# Patient Record
Sex: Male | Born: 1995 | Race: Black or African American | Hispanic: No | Marital: Single | Smoking: Never smoker
Health system: Southern US, Community
[De-identification: ages and names within clinical notes are randomized; demographics above are authoritative.]

---
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
## 2018-09-06 ENCOUNTER — Other Ambulatory Visit: Payer: Self-pay

## 2018-09-06 DIAGNOSIS — Z20822 Contact with and (suspected) exposure to covid-19: Secondary | ICD-10-CM

## 2018-09-07 LAB — NOVEL CORONAVIRUS, NAA: SARS-CoV-2, NAA: NOT DETECTED

## 2018-12-29 ENCOUNTER — Other Ambulatory Visit: Payer: Self-pay | Admitting: *Deleted

## 2018-12-29 DIAGNOSIS — U071 COVID-19: Secondary | ICD-10-CM

## 2019-01-01 ENCOUNTER — Other Ambulatory Visit: Payer: Self-pay

## 2019-01-01 ENCOUNTER — Ambulatory Visit (HOSPITAL_COMMUNITY): Payer: BC Managed Care – PPO | Attending: Cardiology

## 2019-01-01 ENCOUNTER — Other Ambulatory Visit: Payer: BC Managed Care – PPO | Admitting: *Deleted

## 2019-01-01 DIAGNOSIS — U071 COVID-19: Secondary | ICD-10-CM

## 2019-01-01 LAB — TROPONIN I (HIGH SENSITIVITY): Troponin I (High Sensitivity): 6 ng/L

## 2019-03-13 ENCOUNTER — Other Ambulatory Visit: Payer: Self-pay | Admitting: Orthopaedic Surgery

## 2019-03-13 DIAGNOSIS — S93324A Dislocation of tarsometatarsal joint of right foot, initial encounter: Secondary | ICD-10-CM

## 2019-03-26 ENCOUNTER — Ambulatory Visit
Admission: RE | Admit: 2019-03-26 | Discharge: 2019-03-26 | Disposition: A | Discharge status: Home / Self Care | Payer: BC Managed Care – PPO | Source: Ambulatory Visit | Attending: Orthopaedic Surgery | Admitting: Orthopaedic Surgery

## 2019-03-26 ENCOUNTER — Other Ambulatory Visit: Payer: Self-pay

## 2019-03-26 DIAGNOSIS — S93324A Dislocation of tarsometatarsal joint of right foot, initial encounter: Secondary | ICD-10-CM

## 2019-03-26 IMAGING — MR MR FOOT*R* W/O CM
5 series · 40 of 40 positions shown · non-contrast
Comparison: None.

CLINICAL DATA: Football injury of the right foot.

EXAM:
MRI OF THE RIGHT FOREFOOT WITHOUT CONTRAST
TECHNIQUE: Multiplanar, multisequence MR imaging of the right forefoot was
performed. No intravenous contrast was administered.

[Series 4: T1 · coronal · right · 3.0mm · 0.31mm/px · 10 of 48 slices shown (1 of 2)]
[im 1/48]
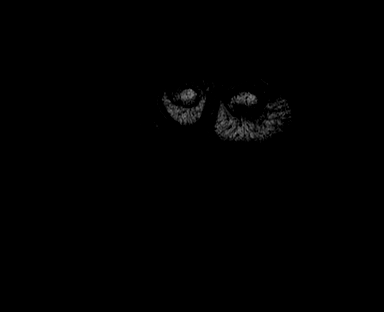
[im 6/48]
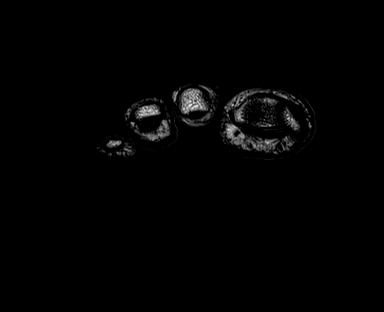
[im 11/48]
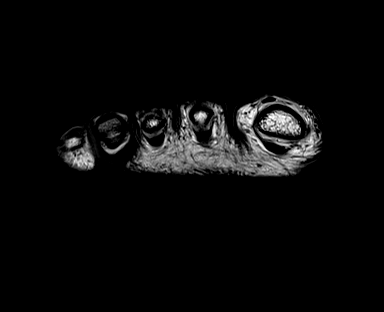
[im 16/48]
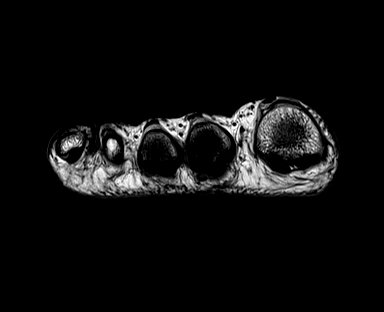
[im 21/48]
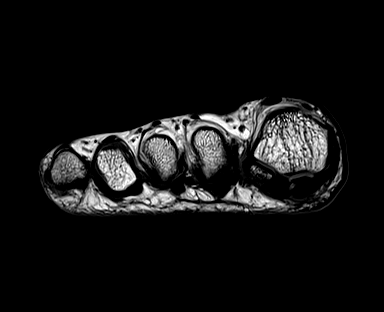
[im 27/48]
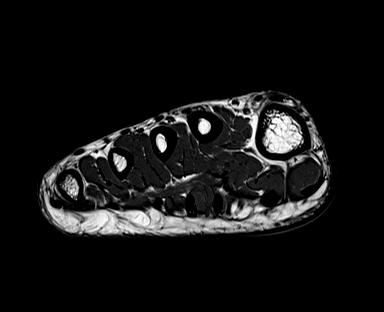
[im 32/48]
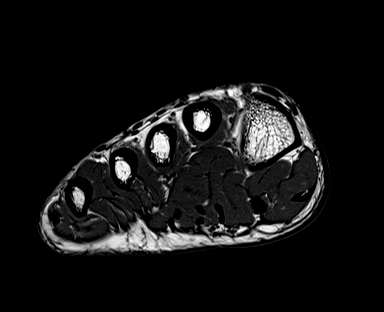
[im 37/48]
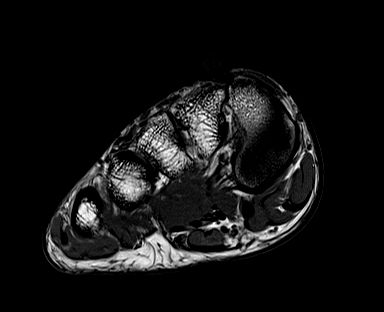
[im 42/48]
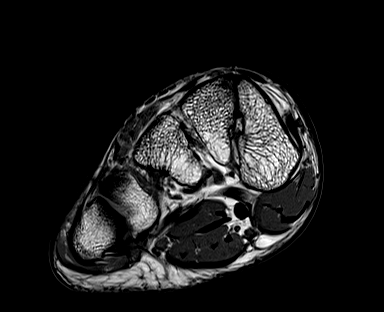
[im 48/48]
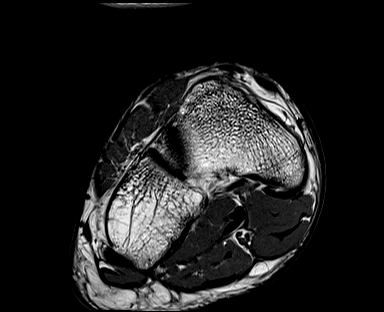

[Series 5: T2 fat-sat · coronal · right · 3.0mm · 0.31mm/px · 11 of 48 slices shown (1 of 2)]
[im 1/48]
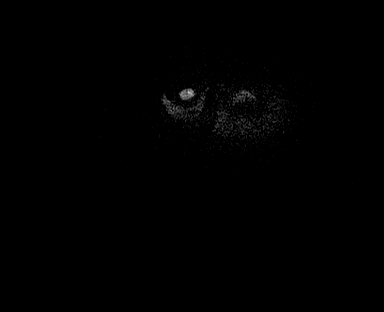
[im 5/48]
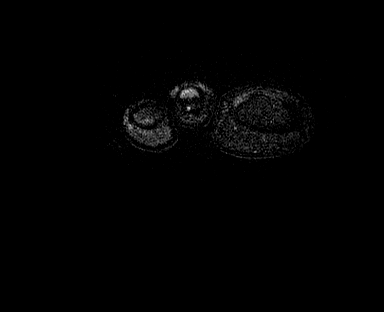
[im 10/48]
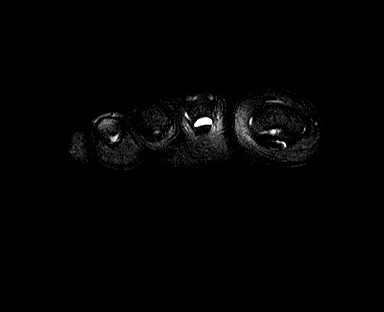
[im 15/48]
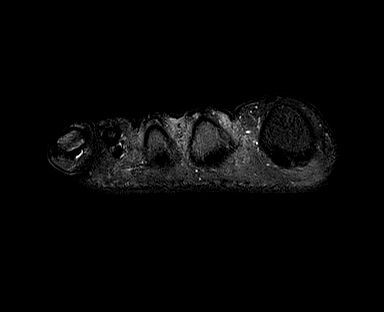
[im 19/48]
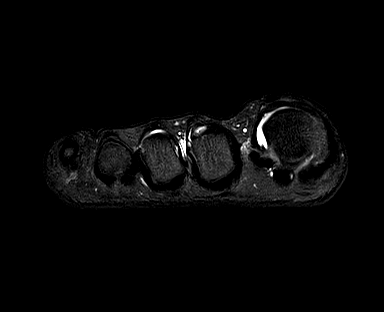
[im 24/48]
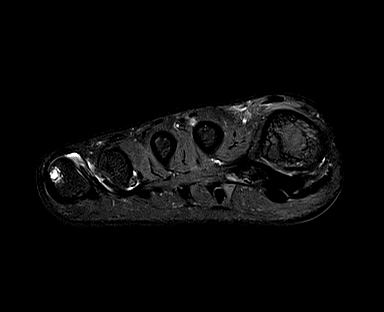
[im 29/48]
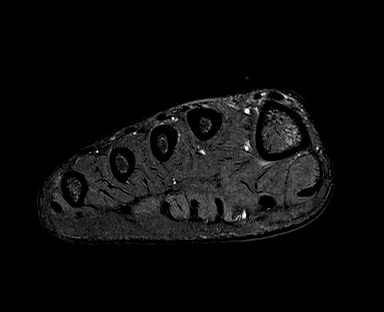
[im 33/48]
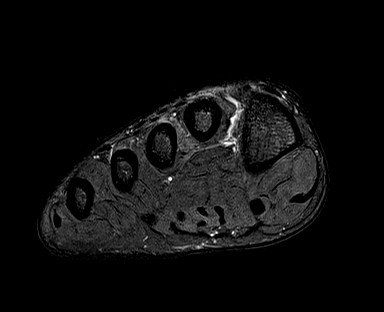
[im 38/48]
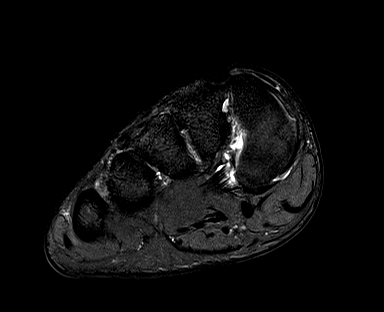
[im 43/48]
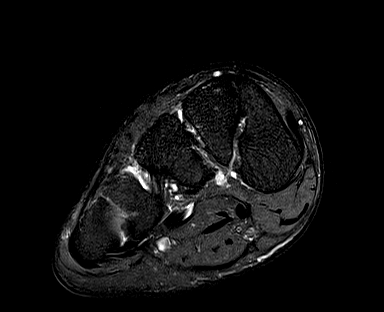
[im 48/48]
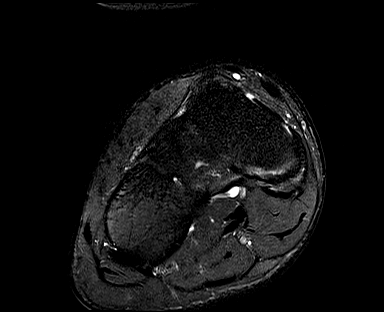

[Series 6: STIR · sagittal · right · 3.0mm · 0.70mm/px · 7 of 29 slices shown]
[im 1/29]
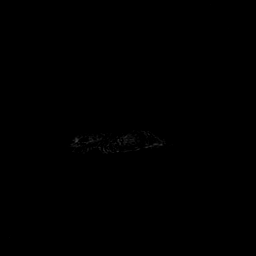
[im 5/29]
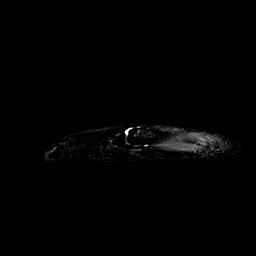
[im 10/29]
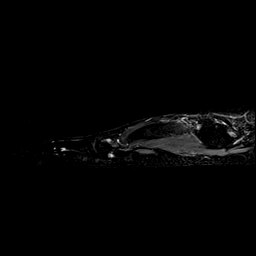
[im 15/29]
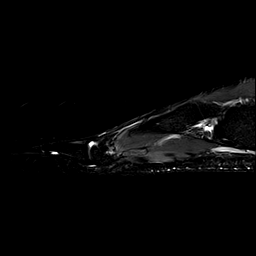
[im 19/29]
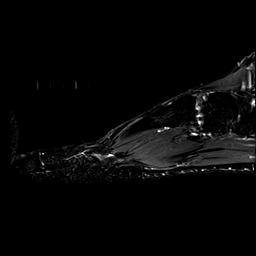
[im 24/29]
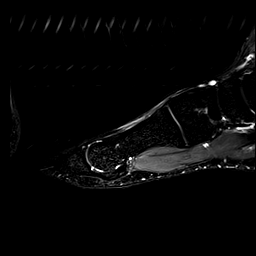
[im 29/29]
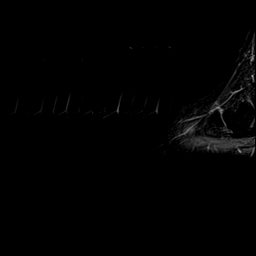

[Series 7: T1 · axial · right · 3.0mm · 0.47mm/px · z∈[-144,-57]mm · 6 of 25 slices shown (2 of 2)]
[im 1/25]
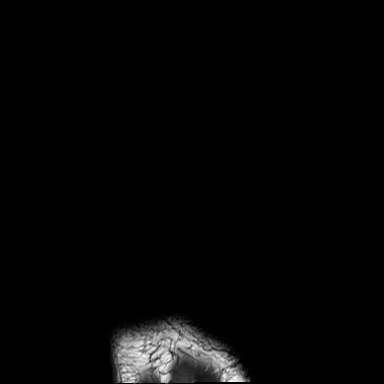
[im 5/25]
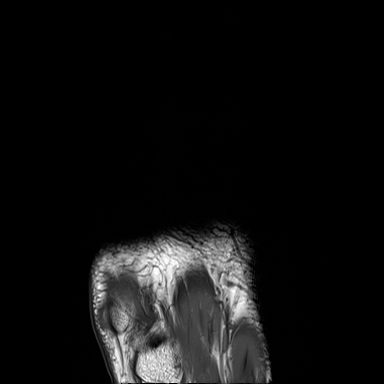
[im 10/25]
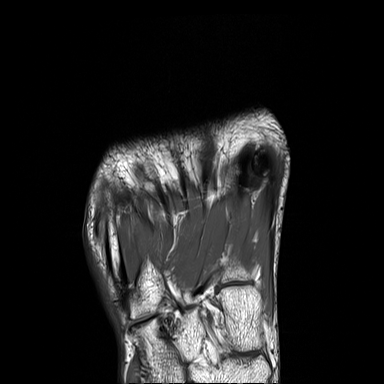
[im 15/25]
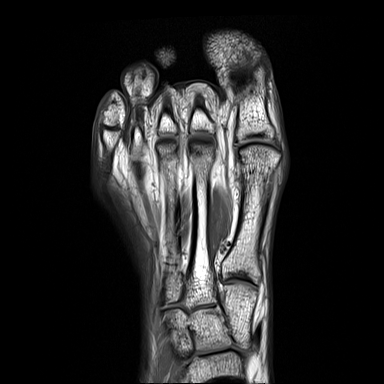
[im 20/25]
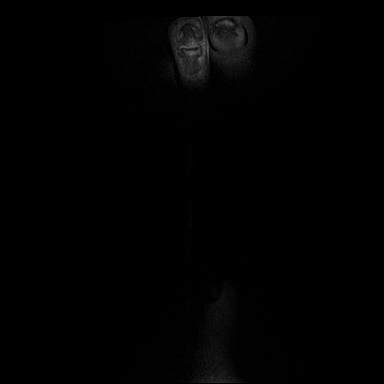
[im 25/25]
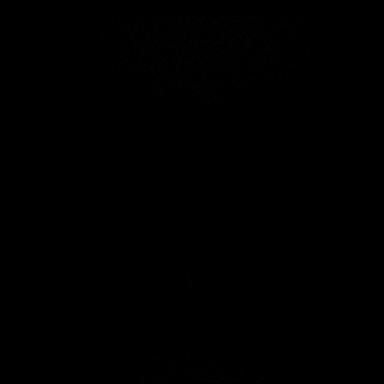

[Series 8: T2 fat-sat · axial · right · 3.0mm · 0.47mm/px · z∈[-144,-57]mm · 6 of 25 slices shown (2 of 2)]
[im 1/25]
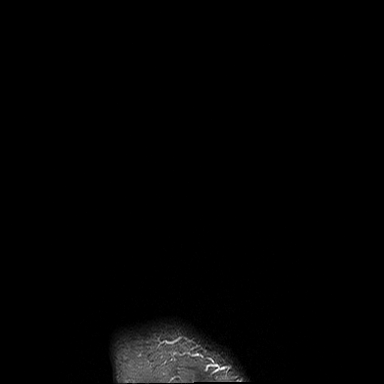
[im 5/25]
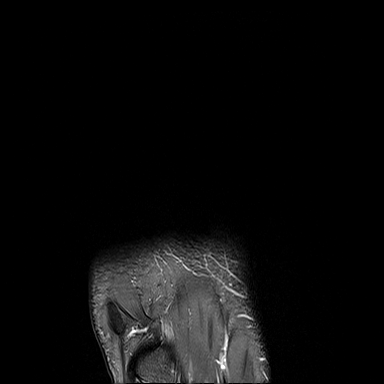
[im 10/25]
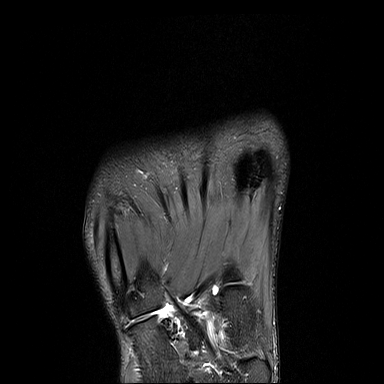
[im 15/25]
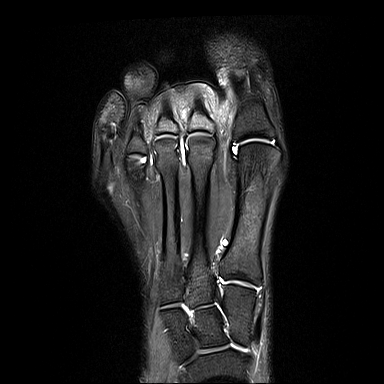
[im 20/25]
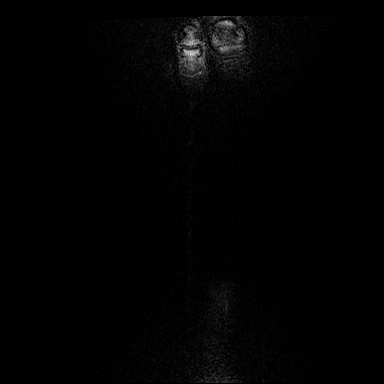
[im 25/25]
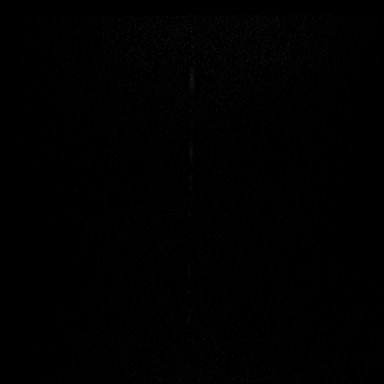

[40 of 40 positions shown; findings below may reference images not displayed]

FINDINGS: Bones/Joint/Cartilage

Markers indicating site of discomfort are just above the base and
the head of the second metatarsal. No abnormal marrow edema is
present along the metatarsal bases or adjacent tarsal bones. There
is a small dorsal effusion between the navicular and the middle
cuneiform shown on image [DATE]. No malalignment at the Lisfranc joint.
No compelling findings of metatarsal stress fracture.

A 0.3 cm focus of accentuated T2 signal is present laterally in the
head of the fifth metatarsal probably represents a small geode.
First digit sesamoids unremarkable.

Ligaments

The Lisfranc ligament appears intact on images 11-12 of series 8.

Muscles and Tendons

Mild distal peroneus longus tenosynovitis

Soft tissues

Unremarkable
IMPRESSION: 1. Small dorsal effusion between the navicular and the middle
cuneiform, without associated marrow edema.
2. Mild distal peroneus longus tenosynovitis.
3. Small geode in the head of the fifth metatarsal.
4. No findings of Lisfranc joint derangement or metatarsal stress
fracture. The Lisfranc ligament appears intact.

## 2019-04-02 ENCOUNTER — Other Ambulatory Visit: Payer: BC Managed Care – PPO

## 2019-05-14 ENCOUNTER — Other Ambulatory Visit: Payer: Self-pay

## 2019-05-14 ENCOUNTER — Ambulatory Visit: Payer: BC Managed Care – PPO | Attending: Family

## 2019-05-14 DIAGNOSIS — Z23 Encounter for immunization: Secondary | ICD-10-CM

## 2019-05-14 NOTE — Progress Notes (Addendum)
   Covid[facility] Vaccination Clinic  Name:  Johnathan Burgess    MRN: [id number] DOB: [birth year]  05/14/2019  Mr. Stickels was observed post Covid-19 immunization for 15 minutes without incident. He was provided with Vaccine Information Sheet and instruction to access the V-Safe system.    Covid-19 Vaccination Clinic  Name:  Johnathan Burgess    MRN: [id number] DOB: July 11, 1995  05/14/2019  Mr. Beddow was observed post Covid-19 immunization for 15 minutes without incident. He was provided with Vaccine Information Sheet and instruction to access the V-Safe system.   Mr. Prosise was instructed to call 911 with any severe reactions post vaccine: Marland Kitchen Difficulty breathing  . Swelling of face and throat  . A fast heartbeat  . A bad rash all over body  . Dizziness and weakness   Immunizations Administered    Name Date Dose VIS Date Route   Moderna COVID-19 Vaccine 05/14/2019 10:10 AM 0.5 mL 12/2018 Intramuscular   Manufacturer: Gala Murdoch   Lot: [id number]   NDC: 93818-299-37      Mr. Ra was instructed to call 911 with any severe reactions post vaccine: Marland Kitchen Difficulty breathing  . Swelling of face and throat  . A fast heartbeat  . A bad rash all over body  . Dizziness and weakness   Immunizations Administered    Name Date Dose VIS Date Route   Moderna COVID-19 Vaccine 05/14/2019 10:10 AM 0.5 mL 12/2018 Intramuscular   Manufacturer: Moderna   Lot: [id number]   NDC: 38101-751-02

## 2019-06-09 ENCOUNTER — Ambulatory Visit: Payer: BC Managed Care – PPO | Attending: Family

## 2019-06-09 DIAGNOSIS — Z23 Encounter for immunization: Secondary | ICD-10-CM

## 2019-06-09 NOTE — Progress Notes (Signed)
   Covid-19 Vaccination Clinic  Name:  Johnathan Burgess    MRN: [id number] DOB: [birth year]  06/09/2019  [name] was observed post Covid-19 immunization for 15 minutes without incident. He was provided with Vaccine Information Sheet and instruction to access the V-Safe system.   [name] was instructed to call 911 with any severe reactions post vaccine: Marland Kitchen Difficulty breathing  . Swelling of face and throat  . A fast heartbeat  . A bad rash all over body  . Dizziness and weakness   Immunizations Administered    Name Date Dose VIS Date Route   Moderna COVID-19 Vaccine 06/09/2019  2:47 PM 0.5 mL 12/2018 Intramuscular   Manufacturer: Moderna   Lot: [id number]   NDC: 28206-015-61

## 2020-01-11 ENCOUNTER — Ambulatory Visit: Payer: BC Managed Care – PPO | Attending: Family

## 2020-01-11 DIAGNOSIS — Z23 Encounter for immunization: Secondary | ICD-10-CM

## 2020-05-17 NOTE — Progress Notes (Signed)
   Covid-19 Vaccination Clinic  Name:  Demon Volante    MRN: [id number] DOB: 06-11-1995  05/17/2020  Mr. Greenblatt was observed post Covid-19 immunization for 15 minutes without incident. He was provided with Vaccine Information Sheet and instruction to access the V-Safe system.   Mr. Seymore was instructed to call 911 with any severe reactions post vaccine: Marland Kitchen Difficulty breathing  . Swelling of face and throat  . A fast heartbeat  . A bad rash all over body  . Dizziness and weakness   Immunizations Administered    Name Date Dose VIS Date Route   Moderna Covid-19 Booster Vaccine 01/11/2020  9:15 AM 0.25 mL 11/11/2019 Intramuscular   Manufacturer: Moderna   Lot: [id number]   NDC: 47092-957-47

## 2020-10-11 ENCOUNTER — Other Ambulatory Visit: Payer: Self-pay

## 2020-10-11 ENCOUNTER — Ambulatory Visit
Admission: RE | Admit: 2020-10-11 | Discharge: 2020-10-11 | Disposition: A | Discharge status: Home / Self Care | Payer: BC Managed Care – PPO | Source: Ambulatory Visit | Attending: Family Medicine | Admitting: Family Medicine

## 2020-10-11 ENCOUNTER — Other Ambulatory Visit: Payer: Self-pay | Admitting: Family Medicine

## 2020-10-11 DIAGNOSIS — M542 Cervicalgia: Secondary | ICD-10-CM

## 2020-10-11 IMAGING — CT CT CERVICAL SPINE W/O CM
3 series · 8 of 14 positions shown, 9 images · non-contrast
Comparison: None.

CLINICAL DATA: Left neck pain, left arm paresthesias, headache,
recent injury

EXAM:
CT CERVICAL SPINE WITHOUT CONTRAST
TECHNIQUE: Multidetector CT imaging of the cervical spine was performed without
intravenous contrast. Multiplanar CT image reconstructions were also
generated.

[Series 3: cspine soft · axial · 0.33mm/px · z∈[-237,-169]mm · 2 of 102 slices shown]
[im 34/102  soft-tissue]
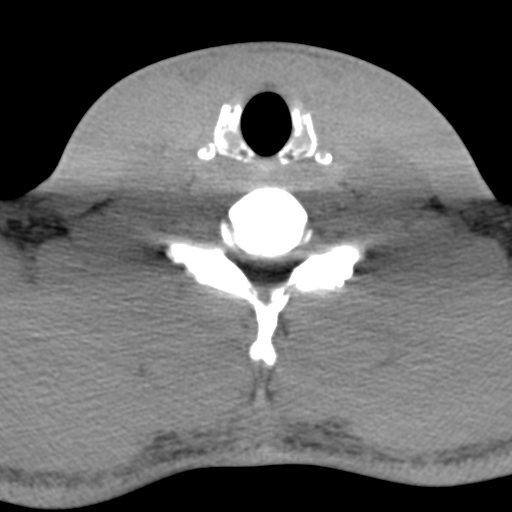
[im 68/102  soft-tissue]
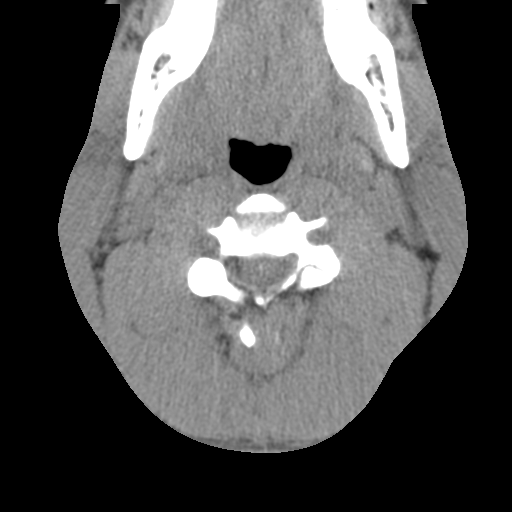

[Series 9: angled axial soft · axial · 0.29mm/px · z∈[-264,-162]mm · 3 of 103 slices shown]
[im 26/103  soft-tissue]
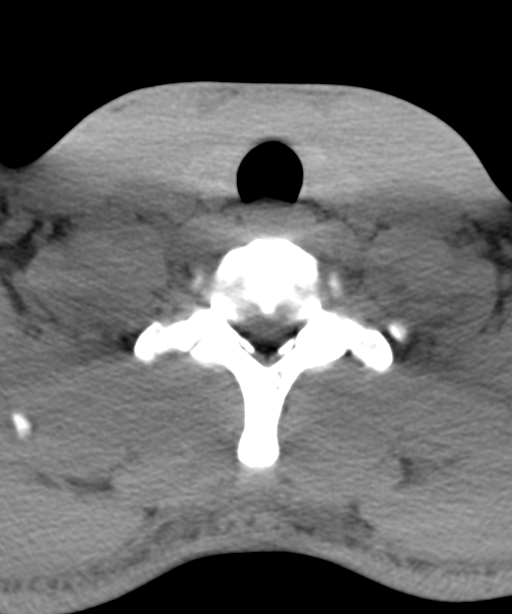
[im 52/103  soft-tissue]
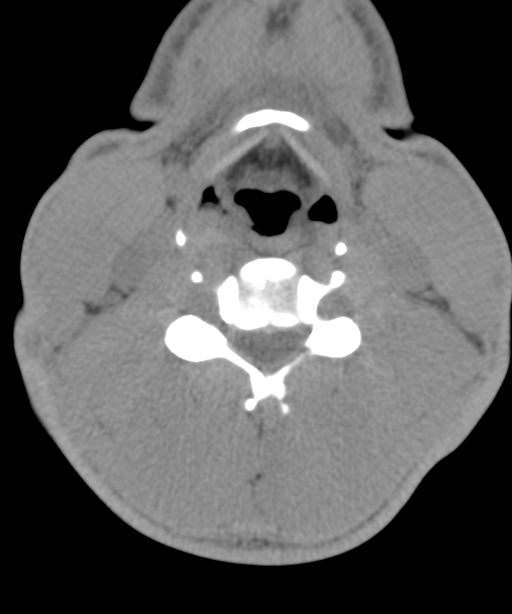
[im 77/103  soft-tissue]
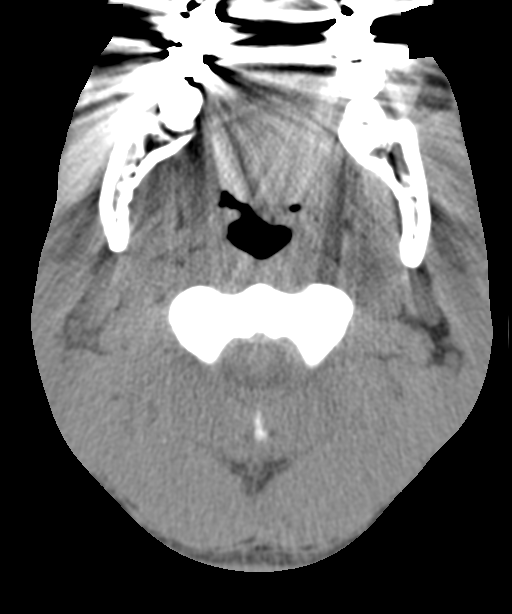

[Series 10: angled axial bone · axial · 0.29mm/px · z∈[-266,-164]mm · 3 of 103 slices shown, 4 images]
[im 26/103  soft-tissue]
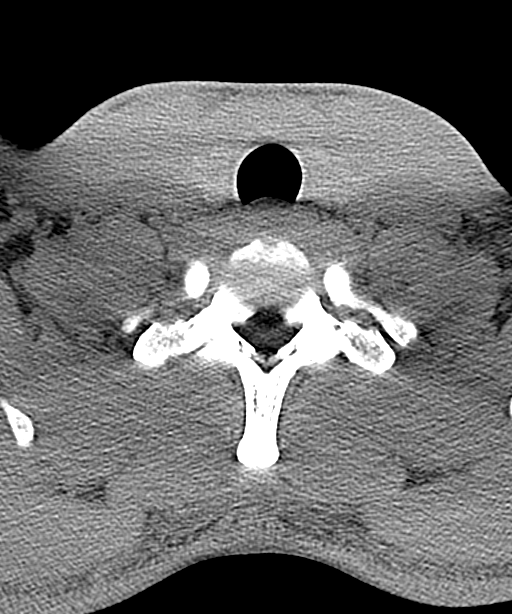
[im 26/103  bone]
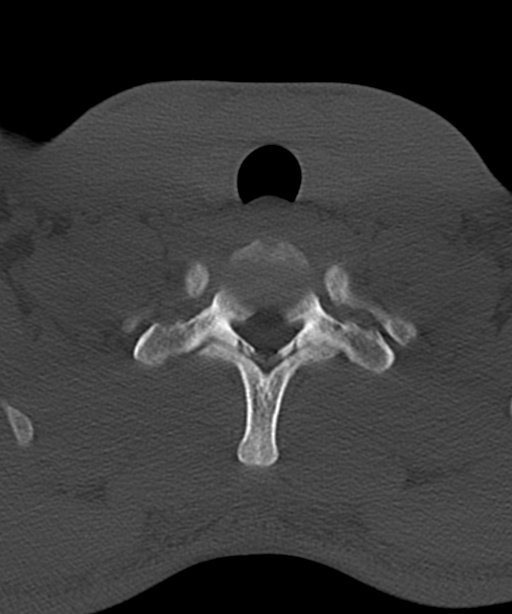
[im 52/103  bone]
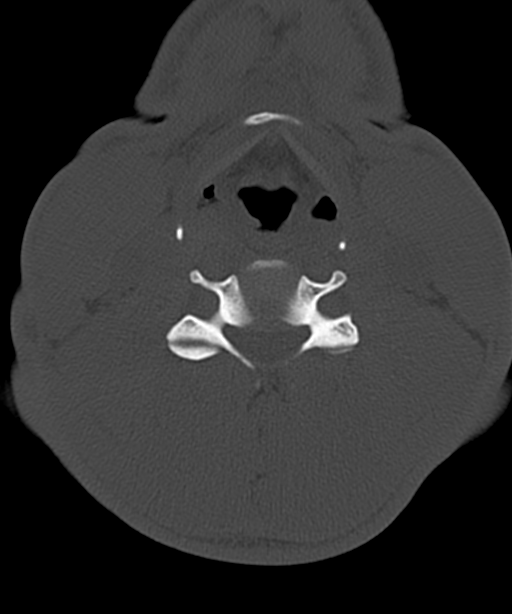
[im 77/103  bone]
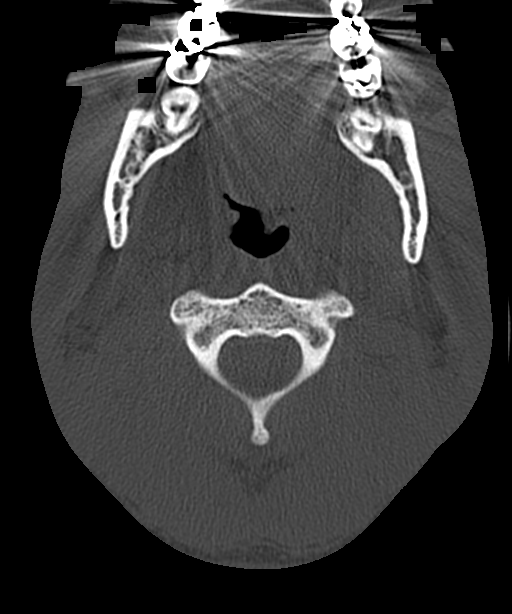

[8 of 14 positions shown; findings below may reference images not displayed]

FINDINGS: Alignment: Normal.

Skull base and vertebrae: No acute fracture. No primary bone lesion
or focal pathologic process.

Soft tissues and spinal canal: No prevertebral fluid or swelling. No
visible canal hematoma.

Disc levels: Preserved vertebral body heights. Facets are aligned.
No subluxation or dislocation. Intact odontoid. Very slight disc
space narrowing at C7-T1 with minor endplate bony spurring
posteriorly. These changes are best appreciated on the sagittal
reconstructions. Otherwise disc spaces preserved. No facet
arthropathy. Neural foramina are widely patent.

Upper chest: Negative.

Other: None.
IMPRESSION: No acute osseous finding, fracture or malalignment by CT.

## 2020-11-05 ENCOUNTER — Emergency Department (HOSPITAL_COMMUNITY)
Admission: EM | Admit: 2020-11-05 | Discharge: 2020-11-05 | Disposition: A | Discharge status: Home / Self Care | Payer: BC Managed Care – PPO | Attending: Emergency Medicine | Admitting: Emergency Medicine

## 2020-11-05 ENCOUNTER — Emergency Department (HOSPITAL_COMMUNITY): Payer: BC Managed Care – PPO

## 2020-11-05 ENCOUNTER — Other Ambulatory Visit: Payer: Self-pay

## 2020-11-05 DIAGNOSIS — R531 Weakness: Secondary | ICD-10-CM | POA: Diagnosis not present

## 2020-11-05 DIAGNOSIS — S139XXA Sprain of joints and ligaments of unspecified parts of neck, initial encounter: Secondary | ICD-10-CM | POA: Diagnosis not present

## 2020-11-05 DIAGNOSIS — Y9361 Activity, american tackle football: Secondary | ICD-10-CM | POA: Diagnosis not present

## 2020-11-05 DIAGNOSIS — W51XXXA Accidental striking against or bumped into by another person, initial encounter: Secondary | ICD-10-CM | POA: Diagnosis not present

## 2020-11-05 DIAGNOSIS — R202 Paresthesia of skin: Secondary | ICD-10-CM | POA: Diagnosis not present

## 2020-11-05 DIAGNOSIS — M79602 Pain in left arm: Secondary | ICD-10-CM

## 2020-11-05 DIAGNOSIS — M79601 Pain in right arm: Secondary | ICD-10-CM

## 2020-11-05 DIAGNOSIS — S199XXA Unspecified injury of neck, initial encounter: Secondary | ICD-10-CM | POA: Diagnosis present

## 2020-11-05 IMAGING — MR MR CERVICAL SPINE W/O CM
4 of 6 series · 20 of 48 positions shown · non-contrast
Comparison: None.

CLINICAL DATA: Football injury

EXAM:
MRI CERVICAL SPINE WITHOUT CONTRAST
TECHNIQUE: Multiplanar, multisequence MR imaging of the cervical spine was
performed. No intravenous contrast was administered.

[Series 2: T2 · sagittal · 3.0mm · 0.43mm/px · 4 of 17 slices shown (1 of 2)]
[im 1/17]
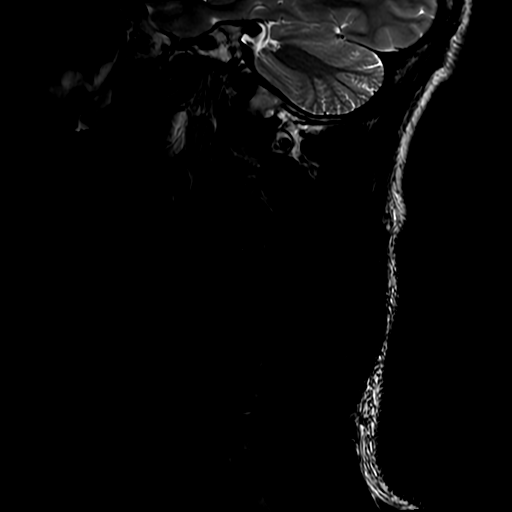
[im 6/17]
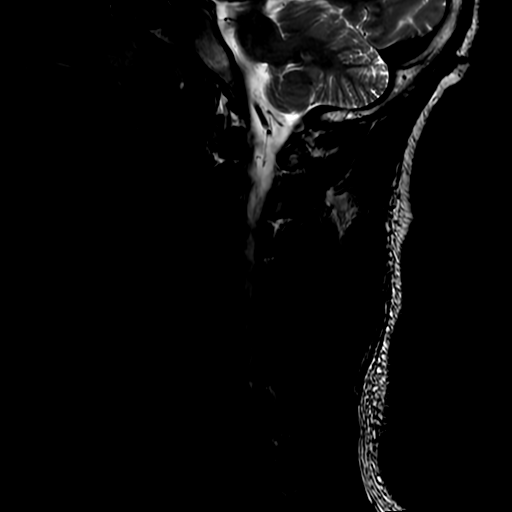
[im 11/17]
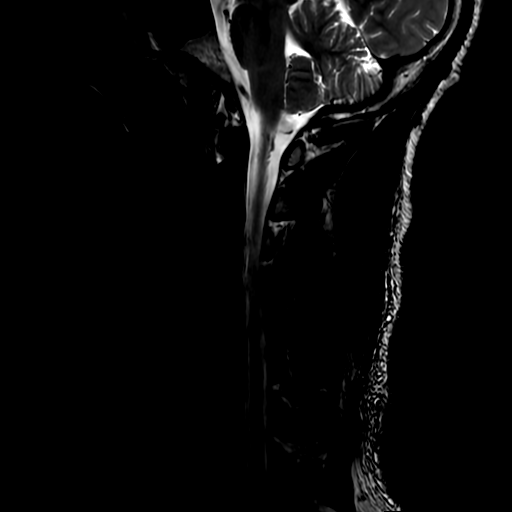
[im 17/17]
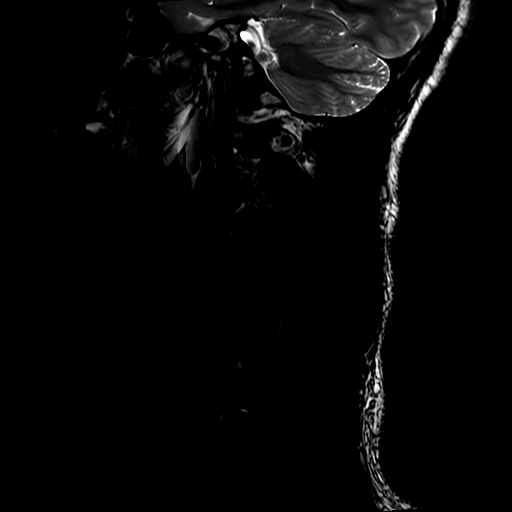

[Series 3: FLAIR · sagittal · 3.0mm · 0.43mm/px · 4 of 17 slices shown]
[im 1/17]
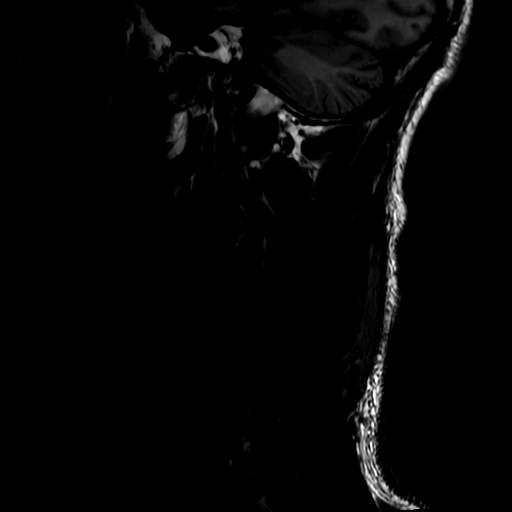
[im 6/17]
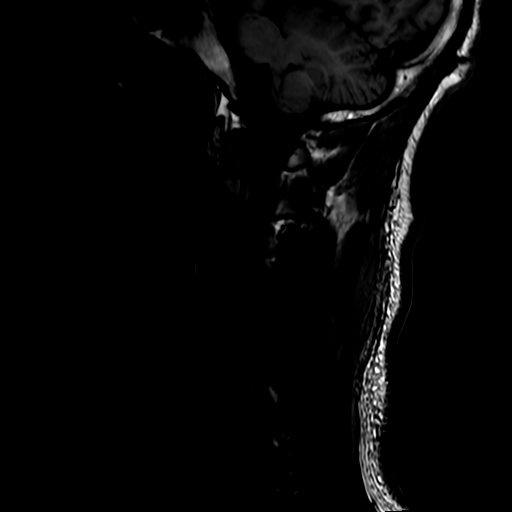
[im 11/17]
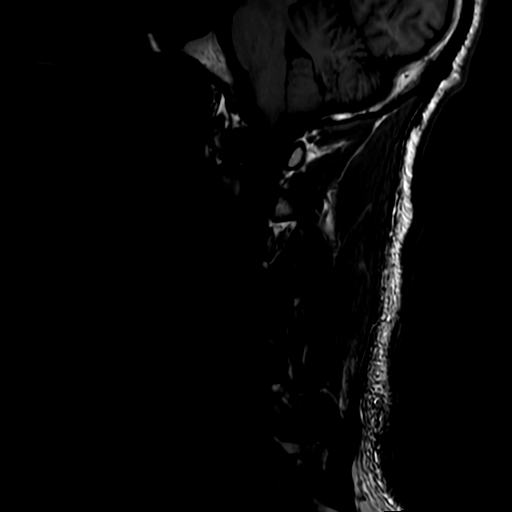
[im 17/17]
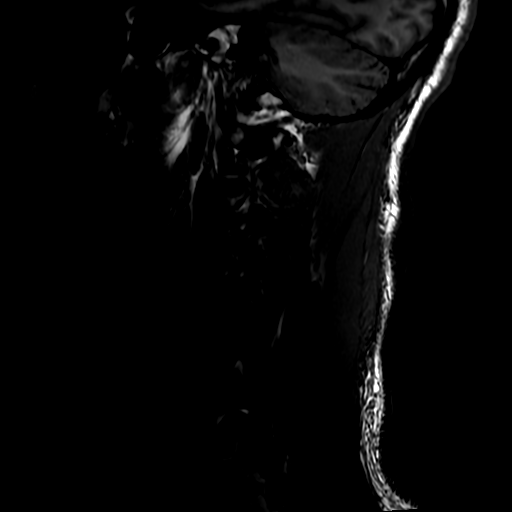

[Series 4: STIR · sagittal · 3.0mm · 0.43mm/px · 4 of 17 slices shown]
[im 1/17]
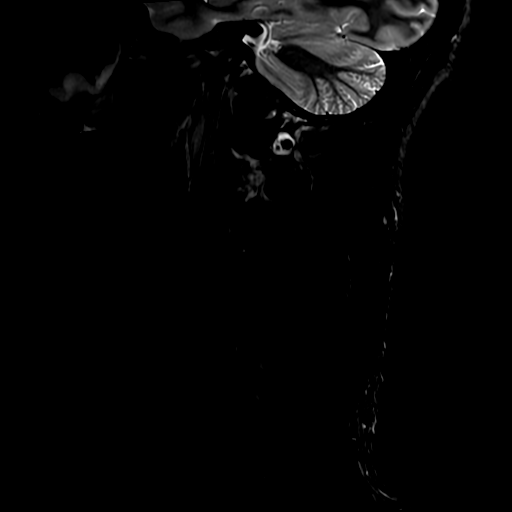
[im 6/17]
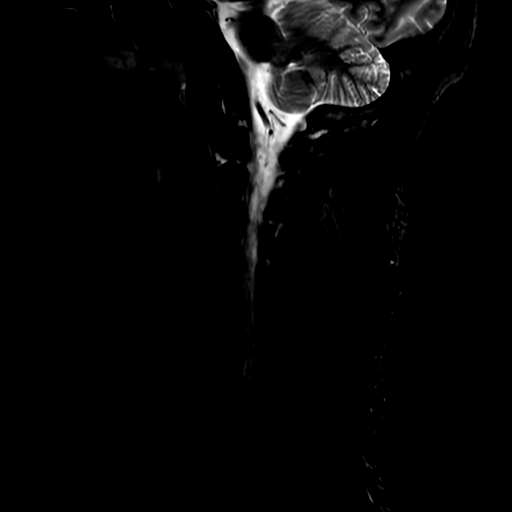
[im 11/17]
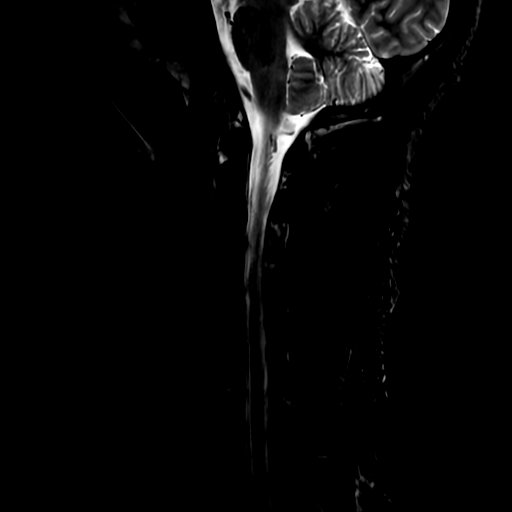
[im 17/17]
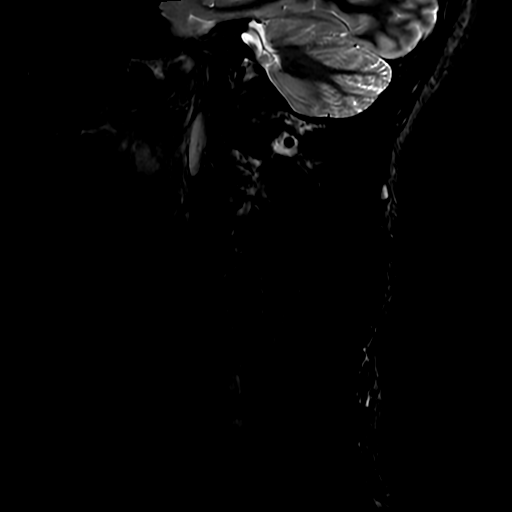

[Series 6: T2 · axial · 3.0mm · 0.35mm/px · z∈[-102,+12]mm · 8 of 36 slices shown (2 of 2)]
[im 1/36]
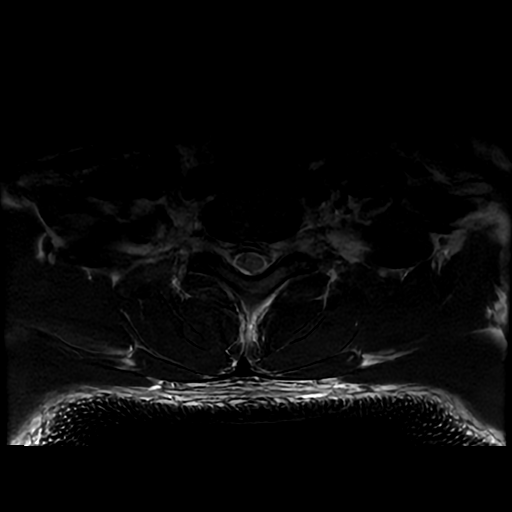
[im 6/36]
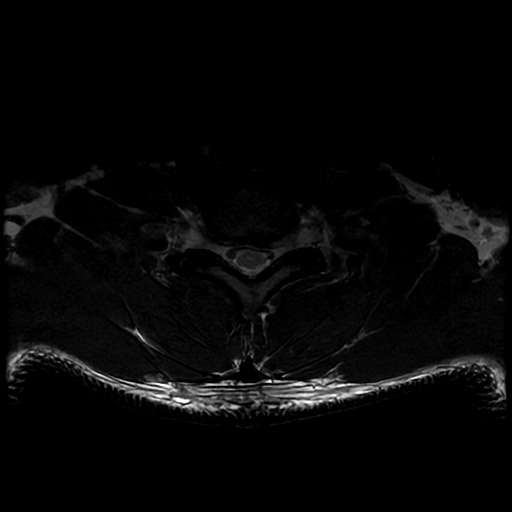
[im 11/36]
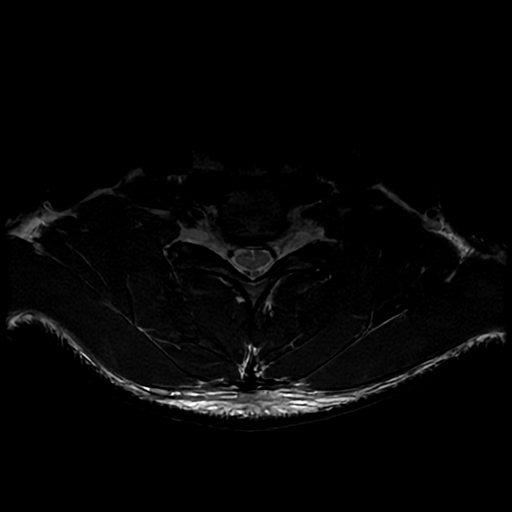
[im 16/36]
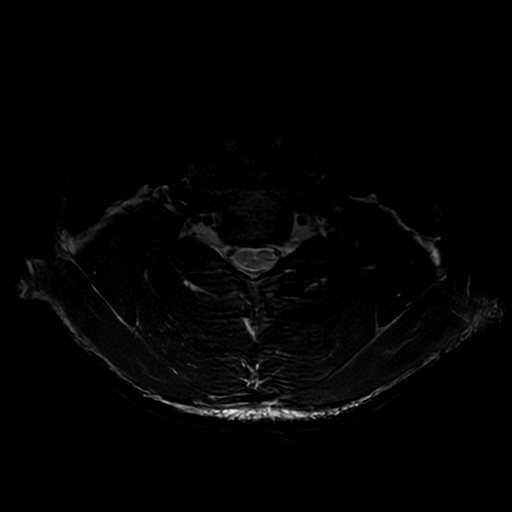
[im 21/36]
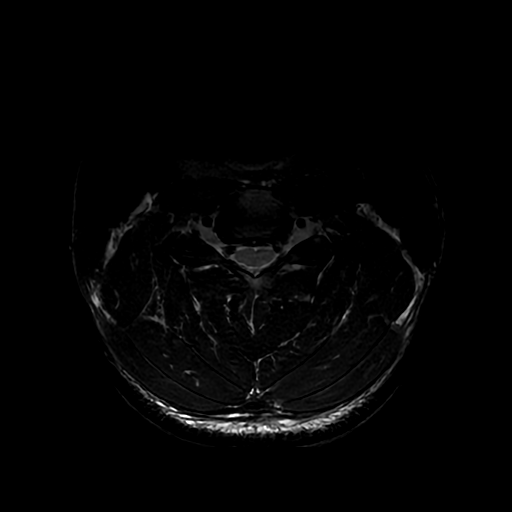
[im 26/36]
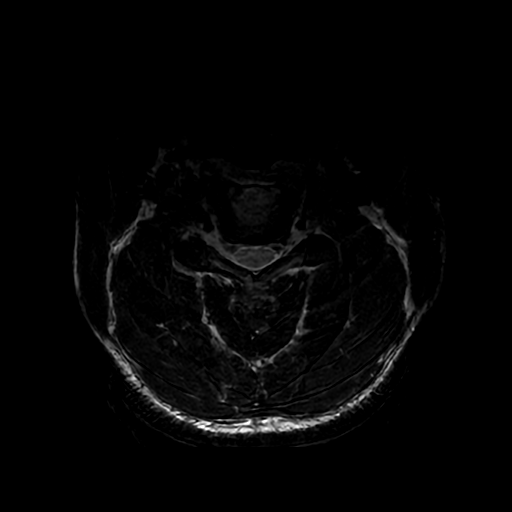
[im 31/36]
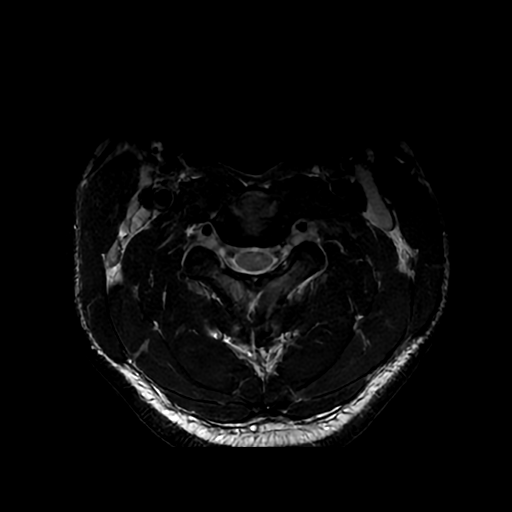
[im 36/36]
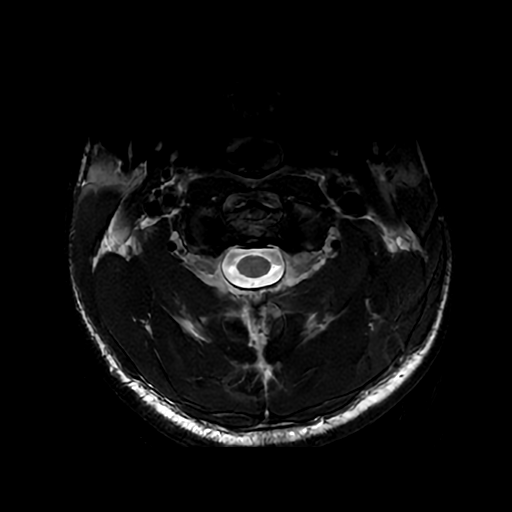

[20 of 48 positions shown; findings below may reference images not displayed]

FINDINGS: Alignment: Physiologic.

Vertebrae: No fracture, evidence of discitis, or bone lesion.

Cord: Normal signal and morphology.

Posterior Fossa, vertebral arteries, paraspinal tissues: Negative.

Ligaments: Normal.

Disc levels:

No disc herniation, spinal canal stenosis or neural impingement.
IMPRESSION: Normal cervical spine MRI.

## 2020-11-05 IMAGING — CT CT HEAD W/O CM
3 of 4 series · 13 of 47 positions shown, 15 images · non-contrast
Comparison: None.

CLINICAL DATA: Head on collision with another player during a
football game, neck and upper back pain

EXAM:
CT HEAD WITHOUT CONTRAST
CT CERVICAL SPINE WITHOUT CONTRAST
CT THORACIC SPINE WITHOUT CONTRAST
TECHNIQUE: Multidetector CT imaging of the head and cervical spine was
performed following the standard protocol without intravenous
contrast. Multiplanar CT image reconstructions of the cervical spine
were also generated.

[Series 3: head without · axial · non-contrast · 0.44mm/px · z∈[-74,+46]mm · 7 of 33 slices shown, 9 images]
[im 5/33  brain]
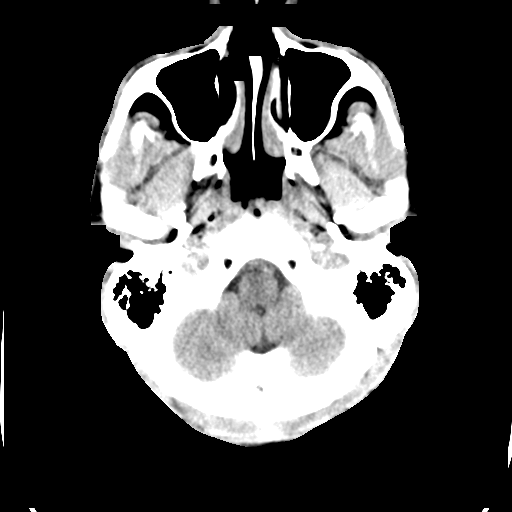
[im 5/33  bone]
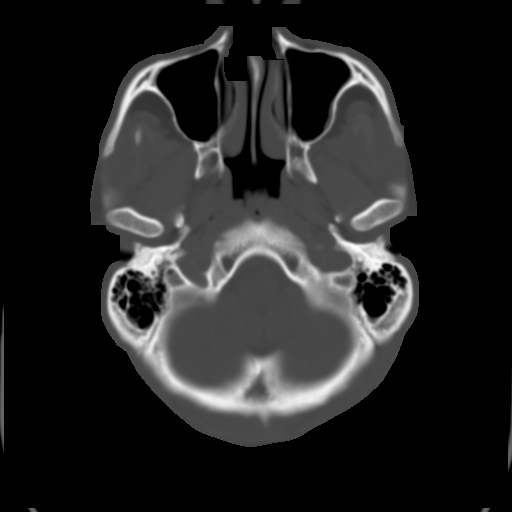
[im 9/33  brain]
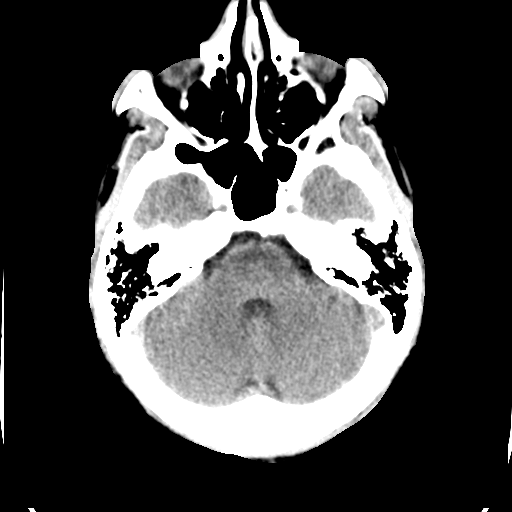
[im 13/33  brain]
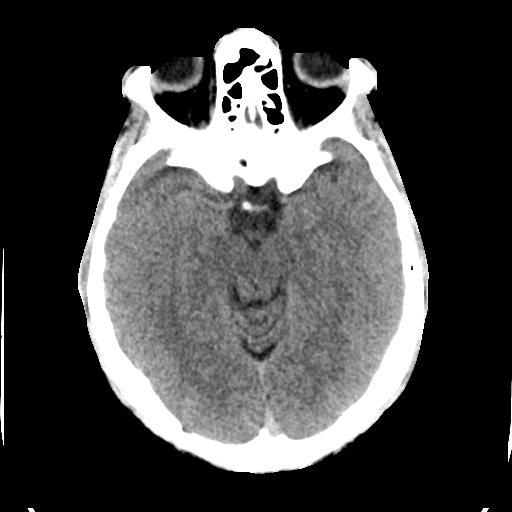
[im 17/33  brain]
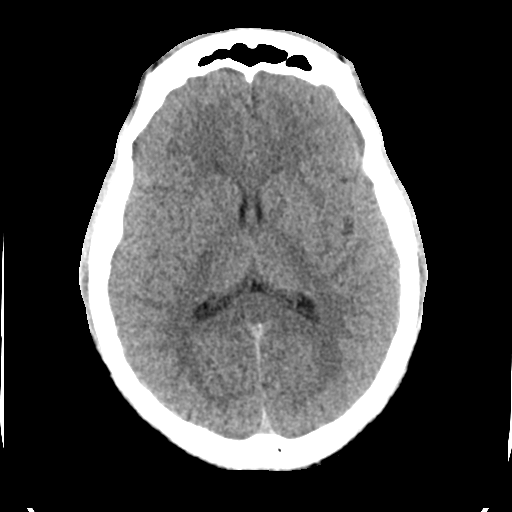
[im 21/33  brain]
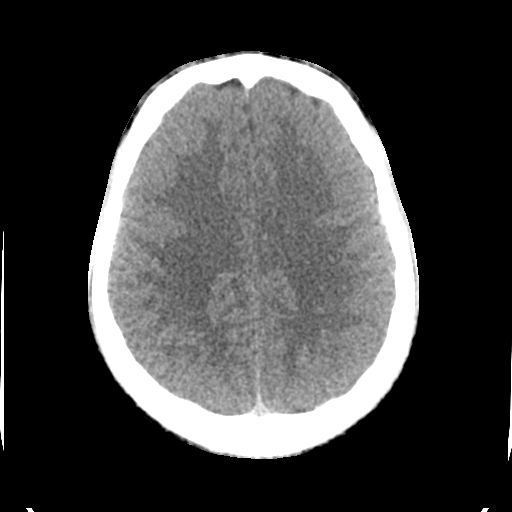
[im 21/33  bone]
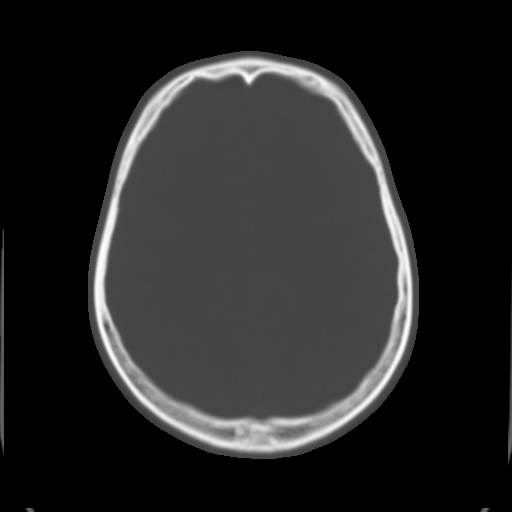
[im 25/33  brain]
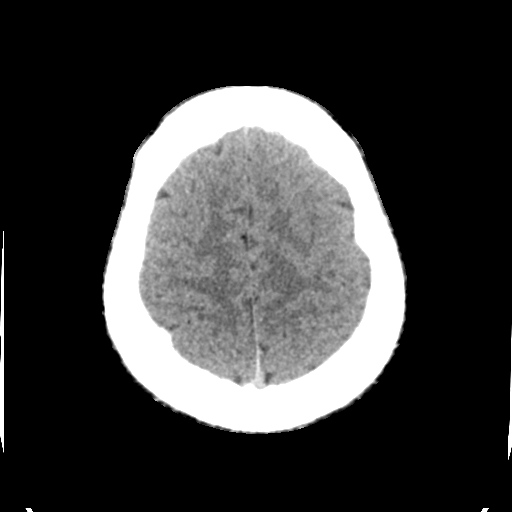
[im 29/33  brain]
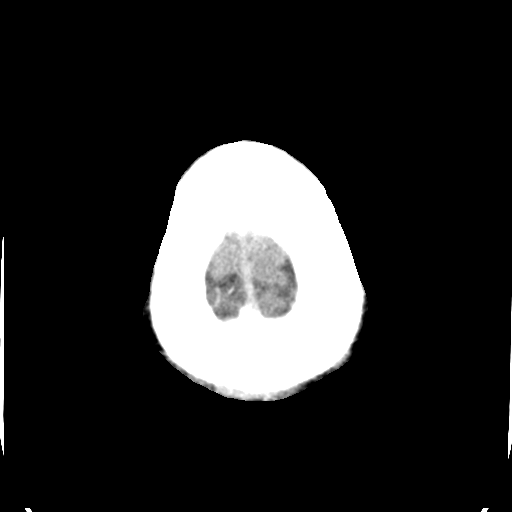

[Series 5: head without cor · coronal · non-contrast · 0.30mm/px · 3 of 76 slices shown]
[im 26/76  brain]
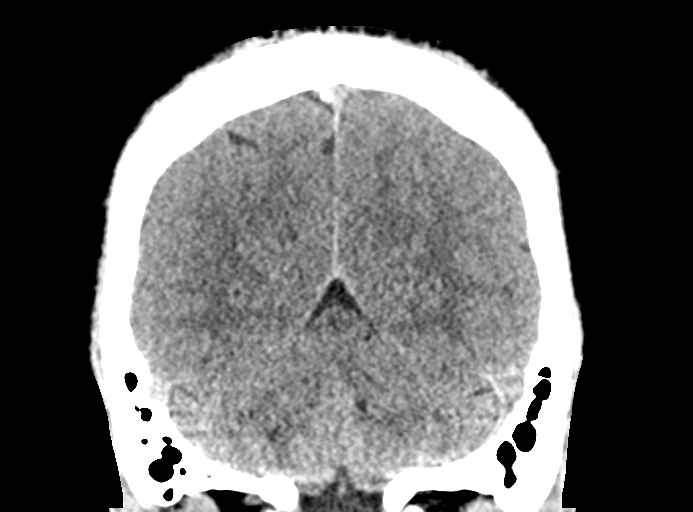
[im 34/76  brain]
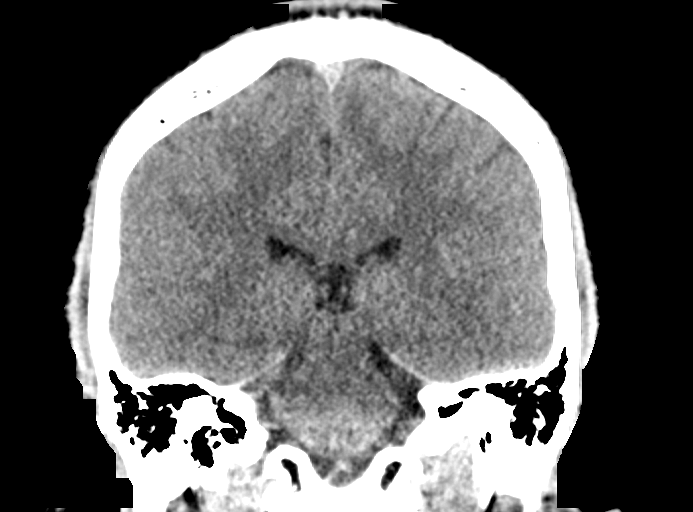
[im 42/76  brain]
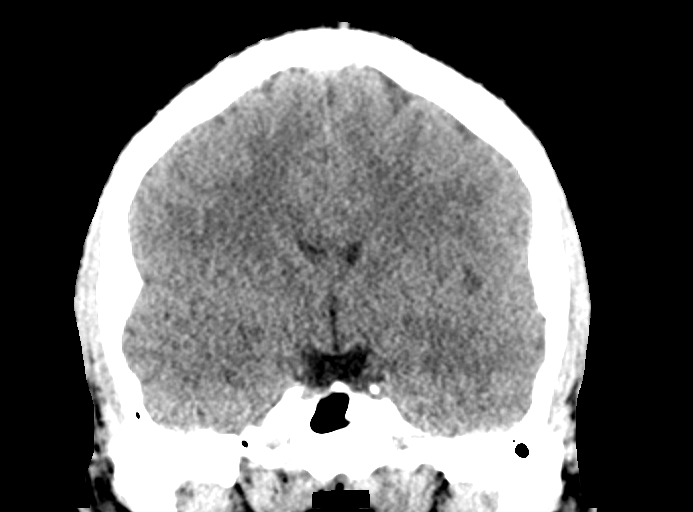

[Series 6: head without sag · sagittal · non-contrast · 0.29mm/px · 3 of 67 slices shown]
[im 23/67  brain]
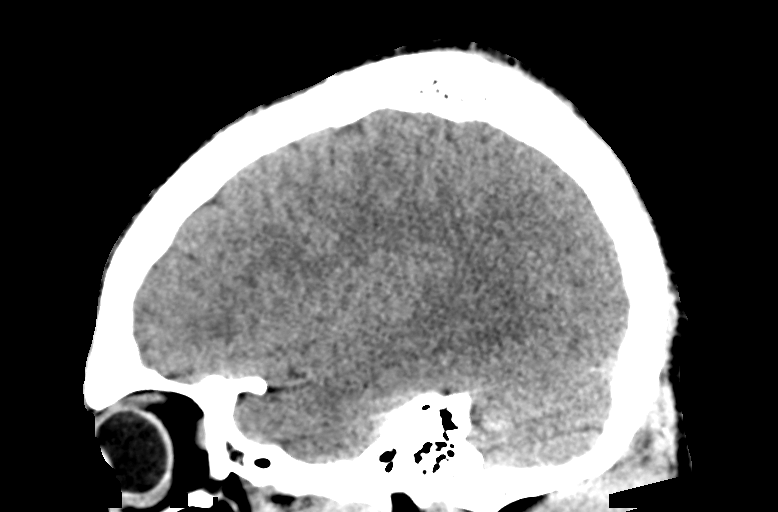
[im 34/67  brain]
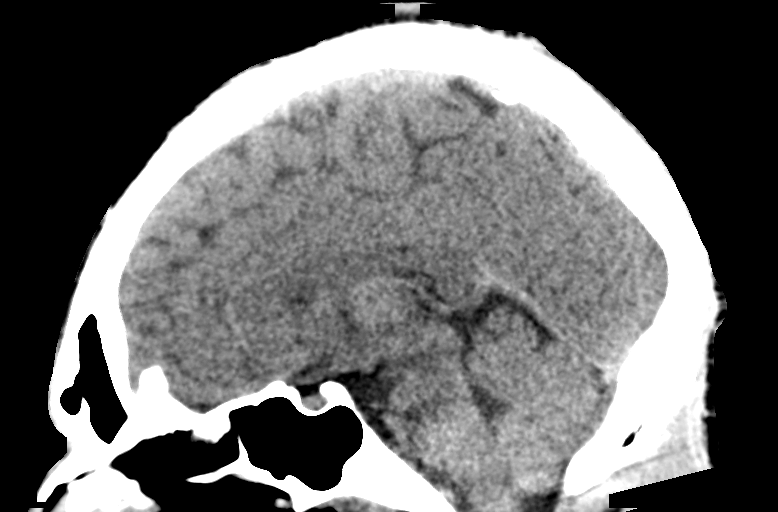
[im 45/67  brain]
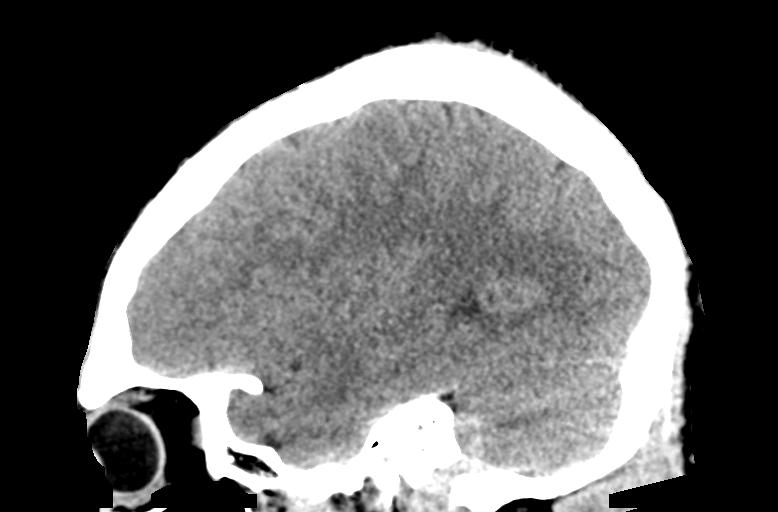

[13 of 47 positions shown; findings below may reference images not displayed]

FINDINGS: CT HEAD FINDINGS

Brain: No evidence of acute infarction, hemorrhage, hydrocephalus,
extra-axial collection or mass lesion/mass effect.

Vascular: No hyperdense vessel or unexpected calcification.

Skull: Normal. Negative for fracture or focal lesion.

Sinuses/Orbits: No acute finding.

Other: None.

CT CERVICAL SPINE FINDINGS

Alignment: Normal.

Skull base and vertebrae: No acute fracture. No primary bone lesion
or focal pathologic process.

Soft tissues and spinal canal: No prevertebral fluid or swelling. No
visible canal hematoma.

Disc levels:  Intact.

Other: None.

CT THORACIC SPINE FINDINGS

Alignment: Normal.

Skull base and vertebrae: No acute fracture. No primary bone lesion
or focal pathologic process.

Soft tissues and spinal canal: No prevertebral fluid or swelling. No
visible canal hematoma.

Disc levels:  Intact.

Other: Incidental small nonobstructive calculus of the lateral
midportion of the left kidney (series 4, image 153).
IMPRESSION: 1. No acute intracranial pathology.
2. No fracture or static subluxation of the cervical or thoracic
spine. Disc spaces are preserved.
3. Incidental small nonobstructive calculus of the lateral
midportion of the left kidney.

## 2020-11-05 IMAGING — CT CT CERVICAL SPINE W/O CM
3 of 4 series · 13 of 33 positions shown, 16 images · non-contrast
Comparison: None.

CLINICAL DATA: Head on collision with another player during a
football game, neck and upper back pain

EXAM:
CT HEAD WITHOUT CONTRAST
CT CERVICAL SPINE WITHOUT CONTRAST
CT THORACIC SPINE WITHOUT CONTRAST
TECHNIQUE: Multidetector CT imaging of the head and cervical spine was
performed following the standard protocol without intravenous
contrast. Multiplanar CT image reconstructions of the cervical spine
were also generated.

[Series 4: c_spine 2.0 st · axial · 0.34mm/px · z∈[-219,-97]mm · 5 of 93 slices shown, 7 images]
[im 16/93  soft-tissue]
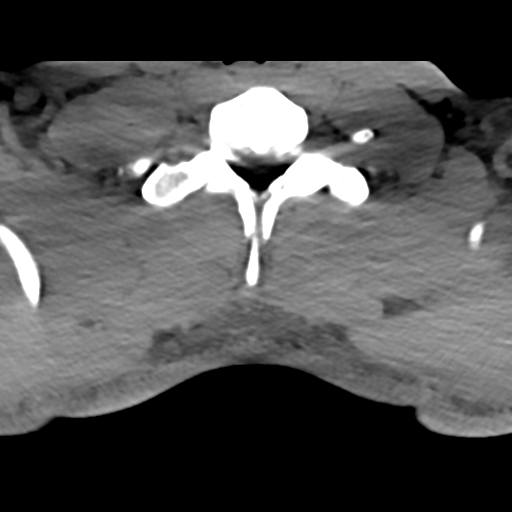
[im 16/93  bone]
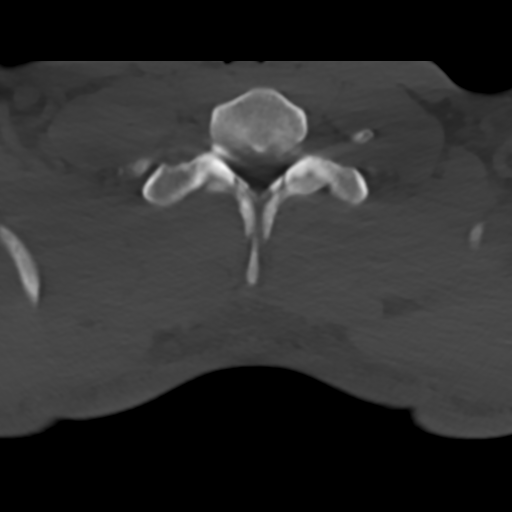
[im 31/93  bone]
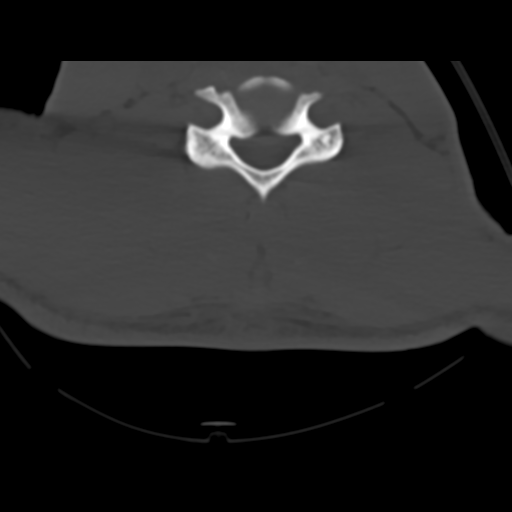
[im 47/93  bone]
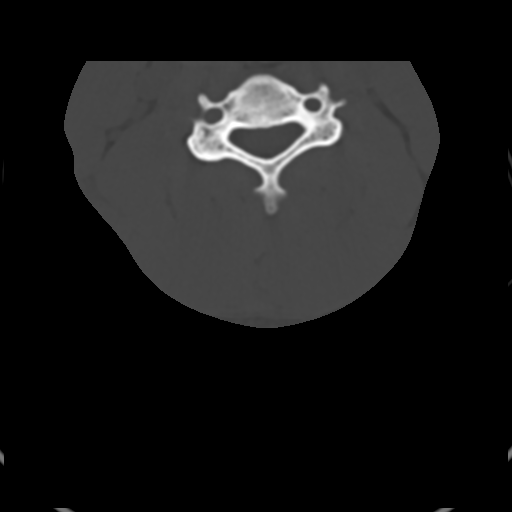
[im 62/93  bone]
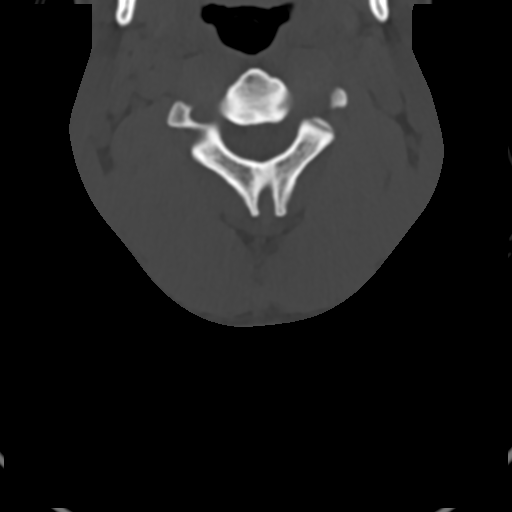
[im 77/93  soft-tissue]
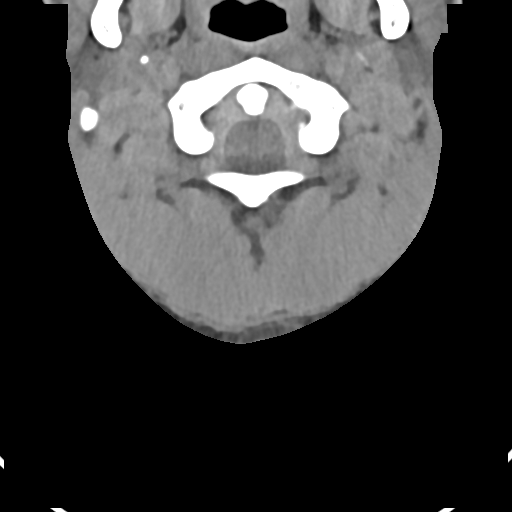
[im 77/93  bone]
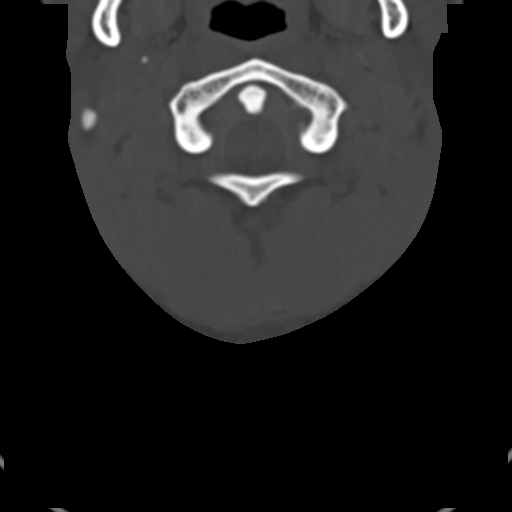

[Series 9: c_spine 2.0 sag bone · sagittal · 0.27mm/px · 5 of 75 slices shown, 6 images]
[im 25/75  bone]
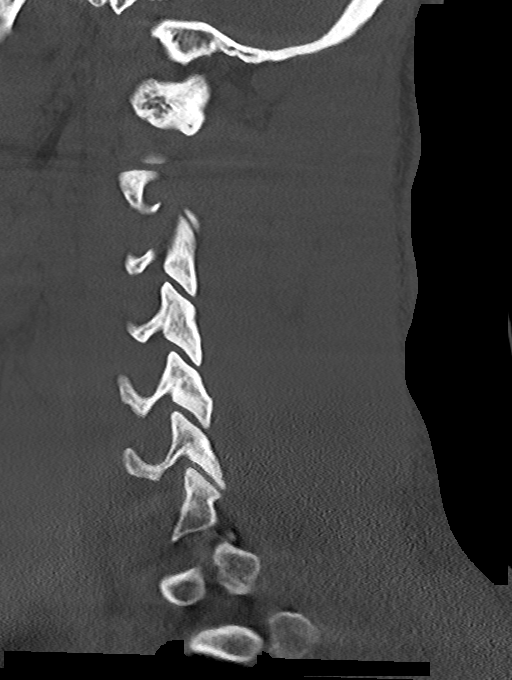
[im 31/75  bone]
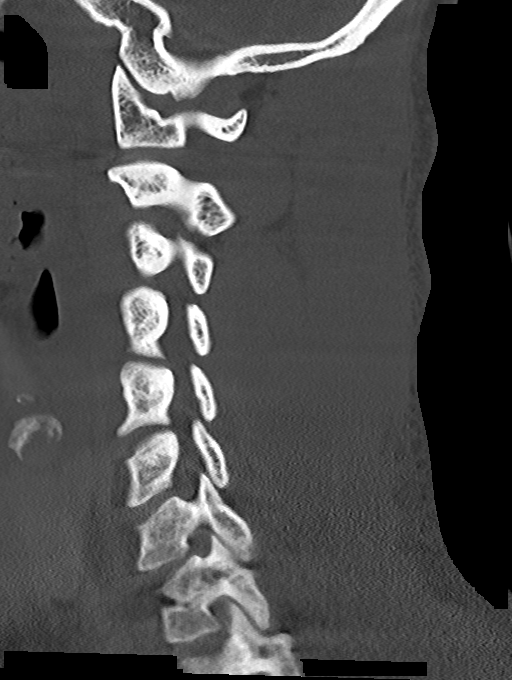
[im 38/75  soft-tissue]
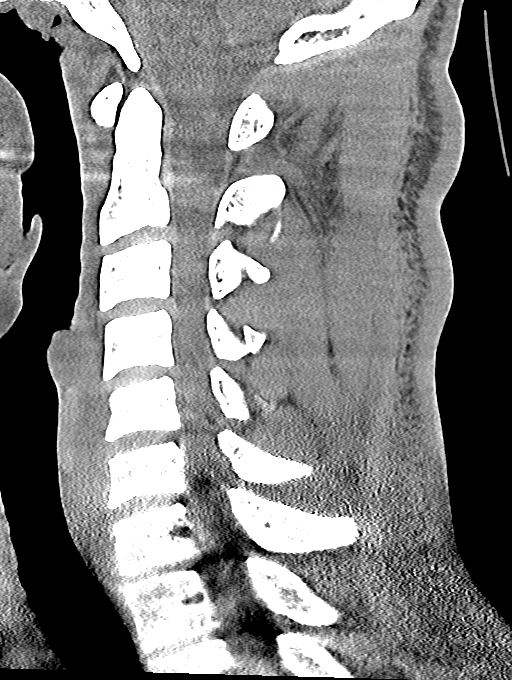
[im 38/75  bone]
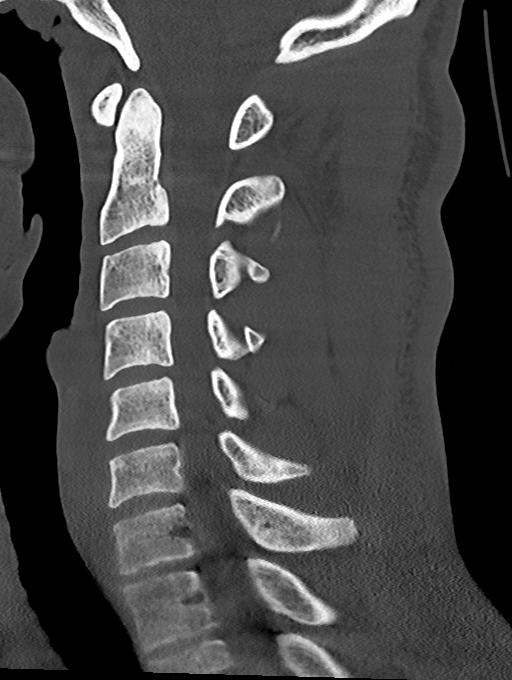
[im 44/75  bone]
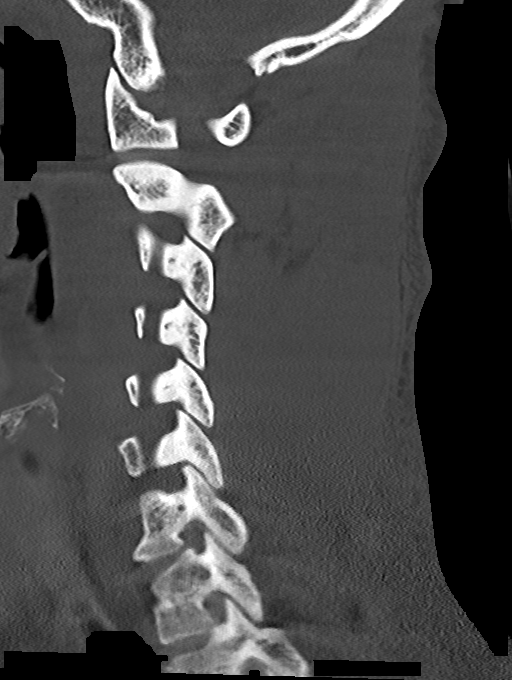
[im 50/75  bone]
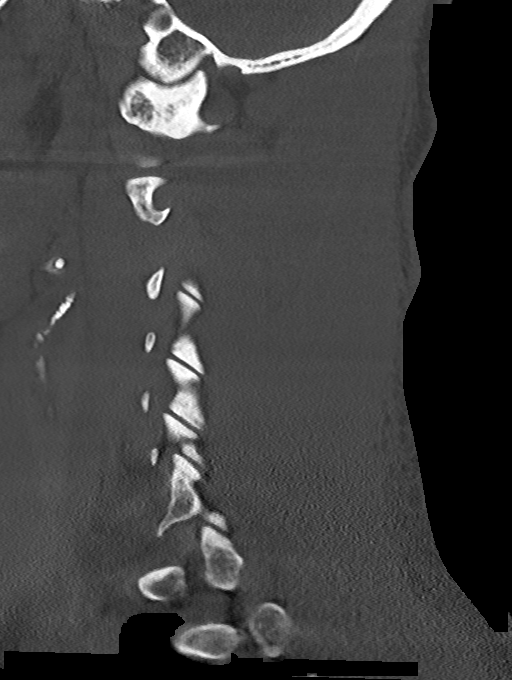

[Series 10: c_spine 2.0 cor bone · coronal · 0.30mm/px · 3 of 61 slices shown]
[im 13/61  bone]
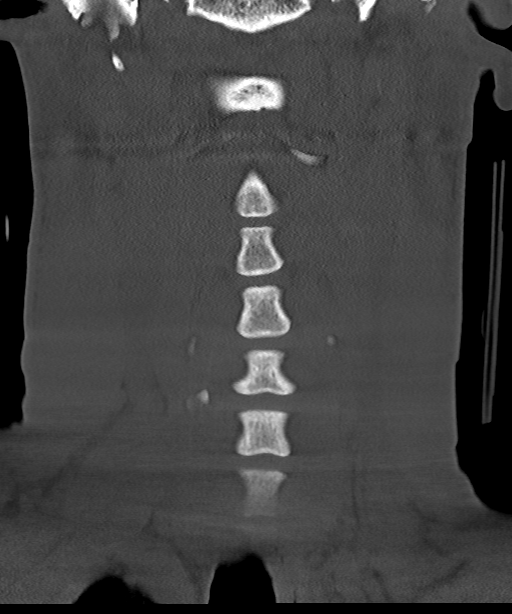
[im 25/61  bone]
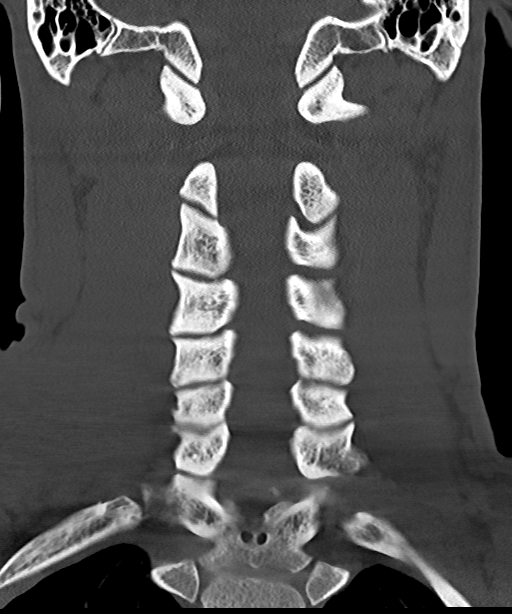
[im 37/61  bone]
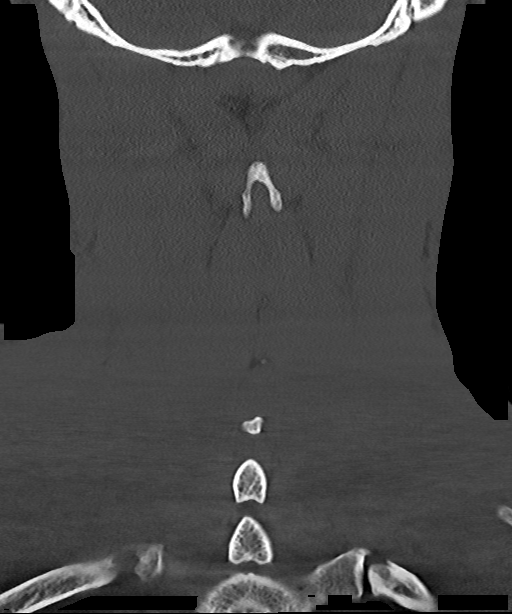

[13 of 33 positions shown; findings below may reference images not displayed]

FINDINGS: CT HEAD FINDINGS

Brain: No evidence of acute infarction, hemorrhage, hydrocephalus,
extra-axial collection or mass lesion/mass effect.

Vascular: No hyperdense vessel or unexpected calcification.

Skull: Normal. Negative for fracture or focal lesion.

Sinuses/Orbits: No acute finding.

Other: None.

CT CERVICAL SPINE FINDINGS

Alignment: Normal.

Skull base and vertebrae: No acute fracture. No primary bone lesion
or focal pathologic process.

Soft tissues and spinal canal: No prevertebral fluid or swelling. No
visible canal hematoma.

Disc levels:  Intact.

Other: None.

CT THORACIC SPINE FINDINGS

Alignment: Normal.

Skull base and vertebrae: No acute fracture. No primary bone lesion
or focal pathologic process.

Soft tissues and spinal canal: No prevertebral fluid or swelling. No
visible canal hematoma.

Disc levels:  Intact.

Other: Incidental small nonobstructive calculus of the lateral
midportion of the left kidney (series 4, image 153).
IMPRESSION: 1. No acute intracranial pathology.
2. No fracture or static subluxation of the cervical or thoracic
spine. Disc spaces are preserved.
3. Incidental small nonobstructive calculus of the lateral
midportion of the left kidney.

## 2020-11-05 IMAGING — CT CT T SPINE W/O CM
3 of 4 series · 9 of 33 positions shown, 10 images · non-contrast
Comparison: None.

CLINICAL DATA: Head on collision with another player during a
football game, neck and upper back pain

EXAM:
CT HEAD WITHOUT CONTRAST
CT CERVICAL SPINE WITHOUT CONTRAST
CT THORACIC SPINE WITHOUT CONTRAST
TECHNIQUE: Multidetector CT imaging of the head and cervical spine was
performed following the standard protocol without intravenous
contrast. Multiplanar CT image reconstructions of the cervical spine
were also generated.

[Series 4: t-spine 2.0 st · axial · 0.39mm/px · z∈[-392,-392]mm · 1 of 184 slices shown, 2 images]
[im 92/184  soft-tissue]
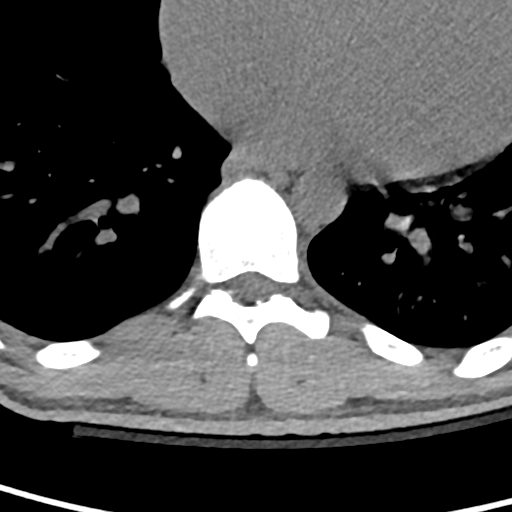
[im 92/184  bone]
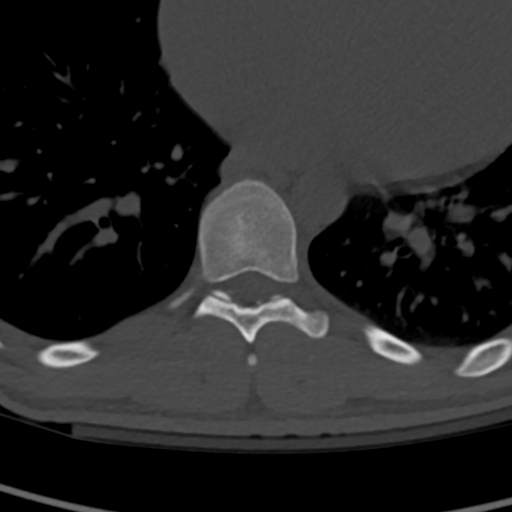

[Series 8: t-spine 2.0 sag bone · sagittal · 0.33mm/px · 5 of 61 slices shown]
[im 21/61  bone]
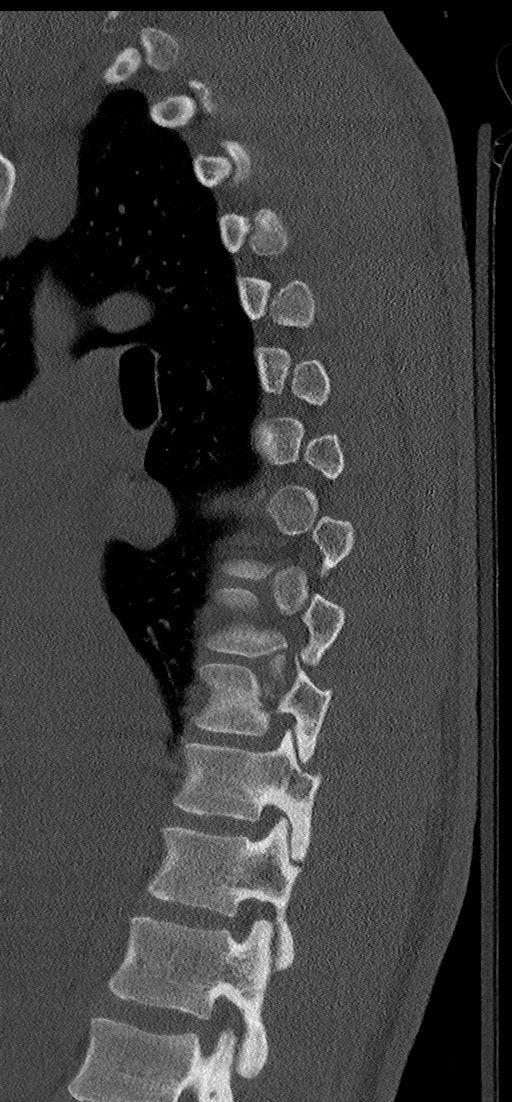
[im 26/61  bone]
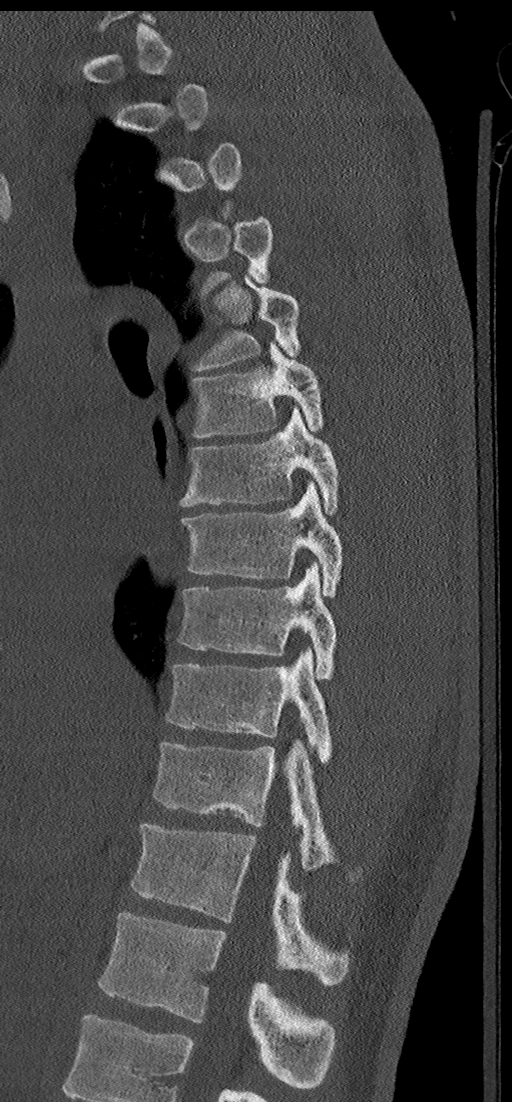
[im 31/61  bone]
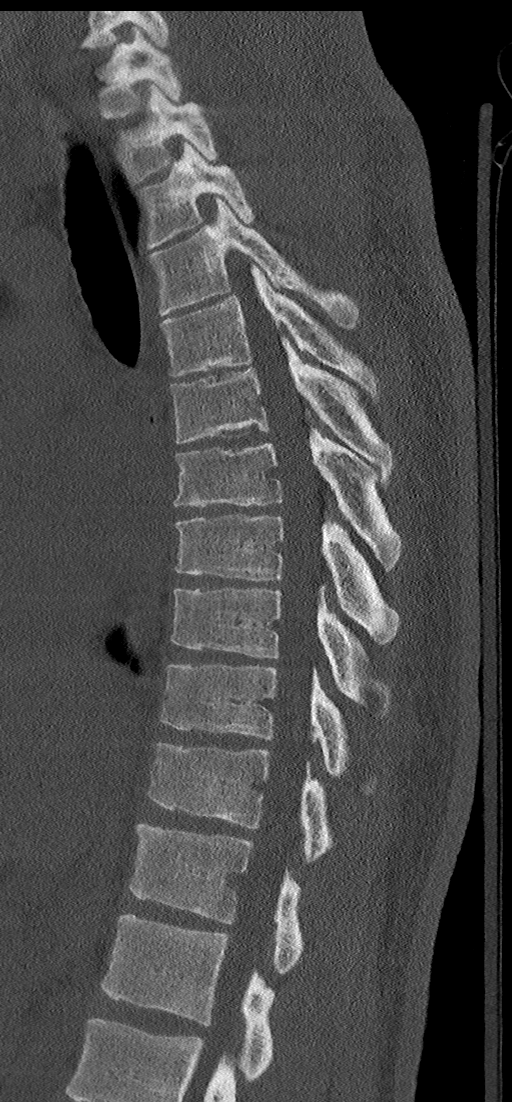
[im 36/61  bone]
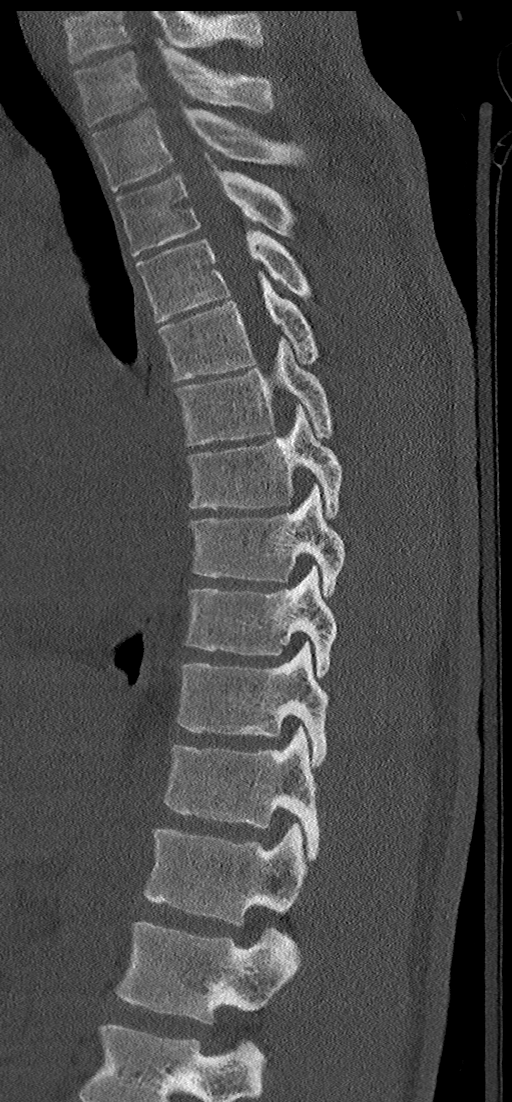
[im 41/61  bone]
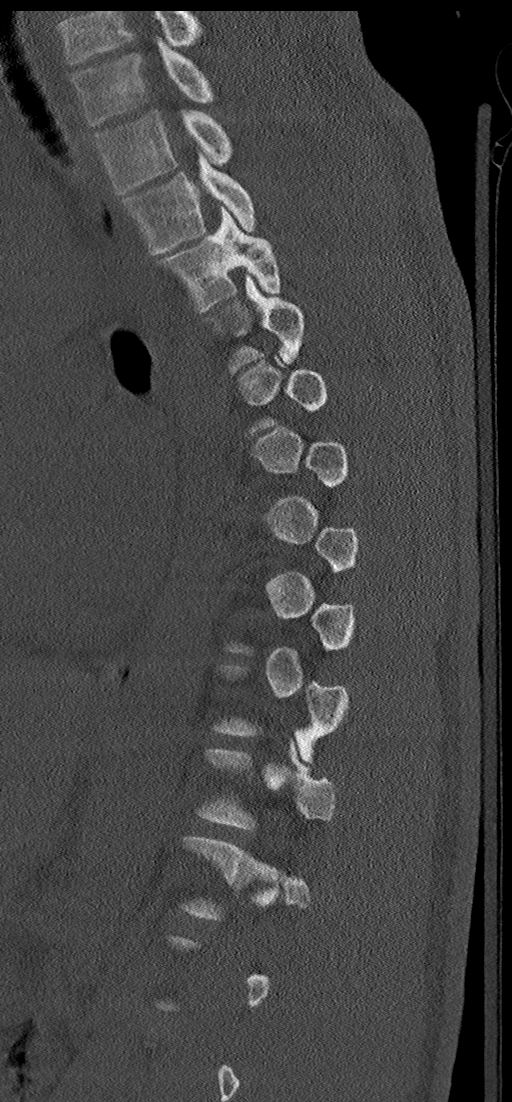

[Series 9: t-spine 2.0 cor bone · coronal · 0.30mm/px · 3 of 79 slices shown]
[im 16/79  bone]
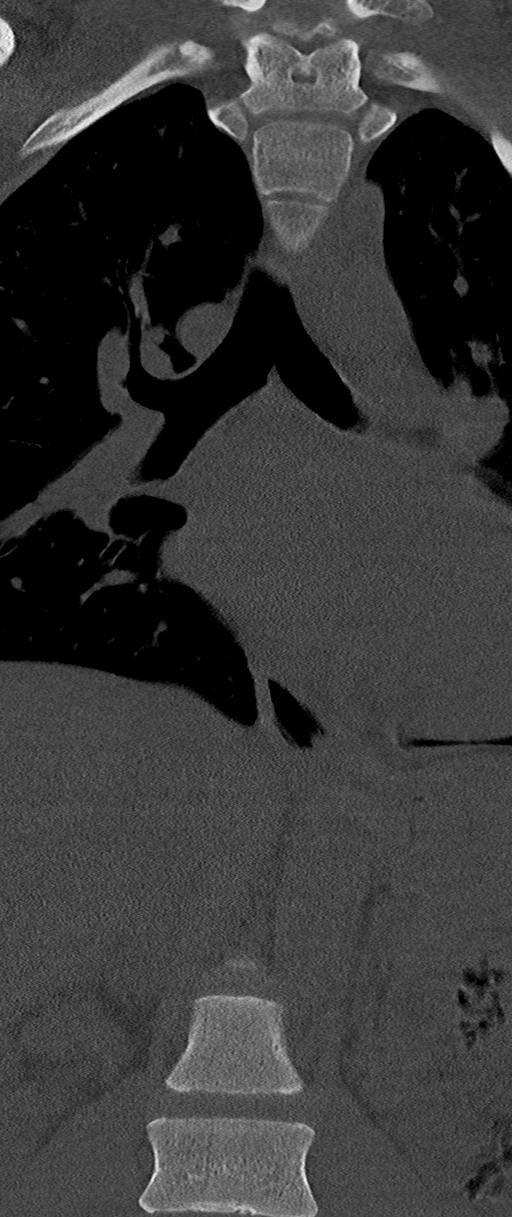
[im 32/79  bone]
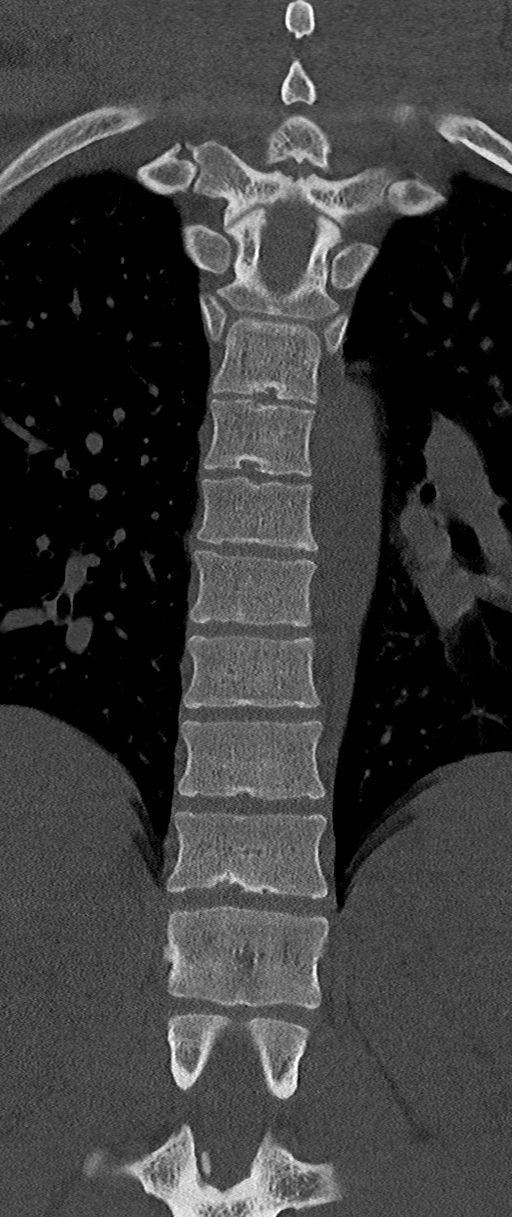
[im 47/79  bone]
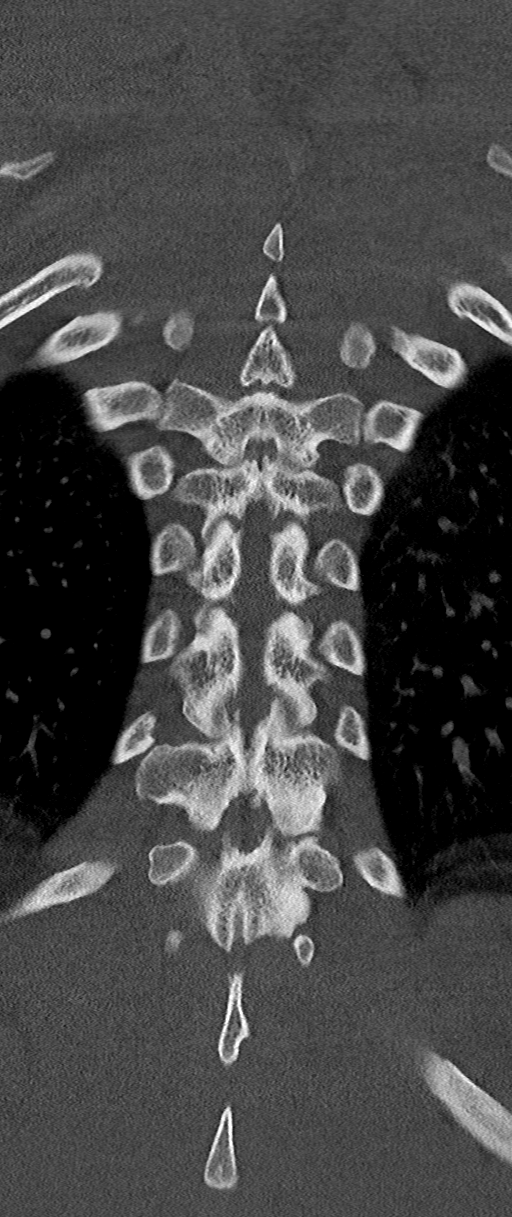

[9 of 33 positions shown; findings below may reference images not displayed]

FINDINGS: CT HEAD FINDINGS

Brain: No evidence of acute infarction, hemorrhage, hydrocephalus,
extra-axial collection or mass lesion/mass effect.

Vascular: No hyperdense vessel or unexpected calcification.

Skull: Normal. Negative for fracture or focal lesion.

Sinuses/Orbits: No acute finding.

Other: None.

CT CERVICAL SPINE FINDINGS

Alignment: Normal.

Skull base and vertebrae: No acute fracture. No primary bone lesion
or focal pathologic process.

Soft tissues and spinal canal: No prevertebral fluid or swelling. No
visible canal hematoma.

Disc levels:  Intact.

Other: None.

CT THORACIC SPINE FINDINGS

Alignment: Normal.

Skull base and vertebrae: No acute fracture. No primary bone lesion
or focal pathologic process.

Soft tissues and spinal canal: No prevertebral fluid or swelling. No
visible canal hematoma.

Disc levels:  Intact.

Other: Incidental small nonobstructive calculus of the lateral
midportion of the left kidney (series 4, image 153).
IMPRESSION: 1. No acute intracranial pathology.
2. No fracture or static subluxation of the cervical or thoracic
spine. Disc spaces are preserved.
3. Incidental small nonobstructive calculus of the lateral
midportion of the left kidney.

## 2020-11-05 MED ORDER — DEXAMETHASONE SODIUM PHOSPHATE 10 MG/ML IJ SOLN
6.0000 mg | Freq: Once | INTRAMUSCULAR | Status: DC
  Filled 2020-11-05: qty 1

## 2020-11-05 MED ORDER — ACETAMINOPHEN 500 MG PO TABS
1000.0000 mg | ORAL_TABLET | Freq: Once | ORAL | Status: AC
  Administered 2020-11-05: 1000 mg via ORAL
  Filled 2020-11-05: qty 2

## 2020-11-05 MED ORDER — METHYLPREDNISOLONE 4 MG PO TBPK: ORAL_TABLET | ORAL | 0 refills | Status: AC

## 2020-11-05 MED ORDER — PREDNISONE 20 MG PO TABS
60.0000 mg | ORAL_TABLET | Freq: Once | ORAL | Status: AC
  Administered 2020-11-05: 60 mg via ORAL
  Filled 2020-11-05: qty 3

## 2020-11-05 MED ORDER — METHOCARBAMOL 750 MG PO TABS: 750.0000 mg | ORAL_TABLET | Freq: Four times a day (QID) | ORAL | 0 refills | Status: DC

## 2020-11-05 MED ORDER — METHOCARBAMOL 500 MG PO TABS
750.0000 mg | ORAL_TABLET | Freq: Once | ORAL | Status: AC
  Administered 2020-11-05: 750 mg via ORAL
  Filled 2020-11-05: qty 2

## 2020-11-05 NOTE — Discharge Instructions (Signed)
1.  Wear your collar for additional comfort. 2.  Take Solu-Medrol as prescribed.  You may also take extra strength Tylenol per package instructions every 6 hours.  You may take Robaxin for muscle relaxer as prescribed 3.  Schedule your follow-up with Dr. Lovell Sheehan as soon as possible as listed in your discharge instructions. 4.  Return to the emergency department if you get any worsening symptoms of weakness numbness suddenly increasing pain or other concerning symptoms.

## 2020-11-05 NOTE — ED Triage Notes (Signed)
Pt arrived by EMS from football game. Pt was playing in game, had a head on collision with another player. He was wearing a helmet, no damage to helmet. Helmet removed on scene  No LOC, immediate 10/10 neck and upper back pain. Hands are numb and tingling bilaterally  No sensation changes to lower extremities  Pt is A&Ox4, C-collar in place.

## 2020-11-05 NOTE — ED Notes (Signed)
Patient transported to CT 
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
## 2020-11-06 ENCOUNTER — Telehealth: Payer: Self-pay | Admitting: Surgery

## 2020-11-06 NOTE — Telephone Encounter (Signed)
ED RNCM received call from patient concerning his prescription for Robaxin which was not received. ED RNCM attempted to call in prescription x3 pharmacist at [facility] was not available to take call. Left a message for a return call from pharmacist.  RNCM will  continue follow up.

## 2020-11-06 NOTE — Telephone Encounter (Signed)
Robaxin prescription called in to [facility] [phone]

## 2021-04-20 ENCOUNTER — Other Ambulatory Visit: Payer: Self-pay

## 2021-04-20 ENCOUNTER — Encounter (HOSPITAL_COMMUNITY): Payer: Self-pay | Admitting: Emergency Medicine

## 2021-04-20 ENCOUNTER — Ambulatory Visit (HOSPITAL_COMMUNITY)
Admission: EM | Admit: 2021-04-20 | Discharge: 2021-04-20 | Disposition: A | Discharge status: Home / Self Care | Payer: BC Managed Care – PPO | Attending: Family Medicine | Admitting: Family Medicine

## 2021-04-20 DIAGNOSIS — S161XXA Strain of muscle, fascia and tendon at neck level, initial encounter: Secondary | ICD-10-CM

## 2021-04-20 MED ORDER — DICLOFENAC SODIUM 75 MG PO TBEC: 75.0000 mg | DELAYED_RELEASE_TABLET | Freq: Two times a day (BID) | ORAL | 0 refills | Status: AC

## 2021-04-20 MED ORDER — CYCLOBENZAPRINE HCL 10 MG PO TABS: ORAL_TABLET | ORAL | 0 refills | Status: AC
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
